# Patient Record
Sex: Female | Born: 1955 | Race: White | Hispanic: No | Marital: Married | Smoking: Never smoker
Health system: Southern US, Community
[De-identification: ages and names within clinical notes are randomized; demographics above are authoritative.]

## PROBLEM LIST (undated history)

## (undated) DIAGNOSIS — G5603 Carpal tunnel syndrome, bilateral upper limbs: Secondary | ICD-10-CM

## (undated) DIAGNOSIS — S0990XS Unspecified injury of head, sequela: Secondary | ICD-10-CM

## (undated) DIAGNOSIS — M5412 Radiculopathy, cervical region: Secondary | ICD-10-CM

## (undated) DIAGNOSIS — G2581 Restless legs syndrome: Secondary | ICD-10-CM

## (undated) DIAGNOSIS — I468 Cardiac arrest due to other underlying condition: Secondary | ICD-10-CM

## (undated) DIAGNOSIS — R748 Abnormal levels of other serum enzymes: Secondary | ICD-10-CM

## (undated) DIAGNOSIS — R29898 Other symptoms and signs involving the musculoskeletal system: Secondary | ICD-10-CM

## (undated) DIAGNOSIS — M50221 Other cervical disc displacement at C4-C5 level: Secondary | ICD-10-CM

## (undated) DIAGNOSIS — M159 Polyosteoarthritis, unspecified: Secondary | ICD-10-CM

## (undated) DIAGNOSIS — I10 Essential (primary) hypertension: Secondary | ICD-10-CM

## (undated) DIAGNOSIS — G931 Anoxic brain damage, not elsewhere classified: Secondary | ICD-10-CM

## (undated) DIAGNOSIS — E041 Nontoxic single thyroid nodule: Secondary | ICD-10-CM

## (undated) DIAGNOSIS — R413 Other amnesia: Secondary | ICD-10-CM

## (undated) DIAGNOSIS — L719 Rosacea, unspecified: Secondary | ICD-10-CM

## (undated) DIAGNOSIS — G6182 Multifocal motor neuropathy: Secondary | ICD-10-CM

## (undated) DIAGNOSIS — F329 Major depressive disorder, single episode, unspecified: Secondary | ICD-10-CM

## (undated) DIAGNOSIS — R928 Other abnormal and inconclusive findings on diagnostic imaging of breast: Secondary | ICD-10-CM

## (undated) DIAGNOSIS — R270 Ataxia, unspecified: Secondary | ICD-10-CM

## (undated) DIAGNOSIS — F32A Depression, unspecified: Secondary | ICD-10-CM

## (undated) DIAGNOSIS — M5126 Other intervertebral disc displacement, lumbar region: Secondary | ICD-10-CM

## (undated) DIAGNOSIS — M5416 Radiculopathy, lumbar region: Secondary | ICD-10-CM

## (undated) DIAGNOSIS — E0590 Thyrotoxicosis, unspecified without thyrotoxic crisis or storm: Secondary | ICD-10-CM

## (undated) DIAGNOSIS — R42 Dizziness and giddiness: Secondary | ICD-10-CM

## (undated) DIAGNOSIS — E119 Type 2 diabetes mellitus without complications: Secondary | ICD-10-CM

## (undated) DIAGNOSIS — M51369 Other intervertebral disc degeneration, lumbar region without mention of lumbar back pain or lower extremity pain: Secondary | ICD-10-CM

## (undated) DIAGNOSIS — M5136 Other intervertebral disc degeneration, lumbar region: Secondary | ICD-10-CM

## (undated) DIAGNOSIS — R1313 Dysphagia, pharyngeal phase: Secondary | ICD-10-CM

## (undated) DIAGNOSIS — K219 Gastro-esophageal reflux disease without esophagitis: Secondary | ICD-10-CM

## (undated) DIAGNOSIS — N951 Menopausal and female climacteric states: Secondary | ICD-10-CM

## (undated) DIAGNOSIS — S5402XA Injury of ulnar nerve at forearm level, left arm, initial encounter: Secondary | ICD-10-CM

## (undated) DIAGNOSIS — G5602 Carpal tunnel syndrome, left upper limb: Secondary | ICD-10-CM

## (undated) DIAGNOSIS — M1990 Unspecified osteoarthritis, unspecified site: Secondary | ICD-10-CM

## (undated) HISTORY — DX: Abnormal levels of other serum enzymes: R74.8

## (undated) HISTORY — PX: BREAST SURGERY: SHX581

## (undated) HISTORY — PX: TUBAL LIGATION: SHX77

## (undated) HISTORY — DX: Other intervertebral disc degeneration, lumbar region without mention of lumbar back pain or lower extremity pain: M51.369

## (undated) HISTORY — DX: Ataxia, unspecified: R27.0

## (undated) HISTORY — DX: Dizziness and giddiness: S09.90XS

## (undated) HISTORY — DX: Polyosteoarthritis, unspecified: M15.9

## (undated) HISTORY — DX: Menopausal and female climacteric states: N95.1

## (undated) HISTORY — DX: Gastro-esophageal reflux disease without esophagitis: K21.9

## (undated) HISTORY — DX: Nontoxic single thyroid nodule: E04.1

## (undated) HISTORY — DX: Other intervertebral disc degeneration, lumbar region: M51.36

## (undated) HISTORY — DX: Other symptoms and signs involving the musculoskeletal system: R29.898

## (undated) HISTORY — PX: SPLENECTOMY, TOTAL: SHX788

## (undated) HISTORY — PX: TONSILLECTOMY: SUR1361

## (undated) HISTORY — DX: Injury of ulnar nerve at forearm level, left arm, initial encounter: S54.02XA

## (undated) HISTORY — DX: Restless legs syndrome: G25.81

## (undated) HISTORY — DX: Carpal tunnel syndrome, bilateral upper limbs: G56.03

## (undated) HISTORY — DX: Radiculopathy, cervical region: M54.12

## (undated) HISTORY — DX: Carpal tunnel syndrome, left upper limb: G56.02

## (undated) HISTORY — DX: Rosacea, unspecified: L71.9

## (undated) HISTORY — DX: Hypercalcemia: E83.52

## (undated) HISTORY — DX: Multifocal motor neuropathy: G61.82

## (undated) HISTORY — PX: HIP ARTHROPLASTY: SHX981

## (undated) HISTORY — DX: Dysphagia, pharyngeal phase: R13.13

## (undated) HISTORY — DX: Dizziness and giddiness: R42

## (undated) HISTORY — DX: Anoxic brain damage, not elsewhere classified: G93.1

## (undated) HISTORY — DX: Radiculopathy, lumbar region: M54.16

## (undated) HISTORY — DX: Other amnesia: R41.3

## (undated) HISTORY — PX: ABDOMINAL HYSTERECTOMY: SHX81

## (undated) HISTORY — PX: TONSILLECTOMY AND ADENOIDECTOMY: SUR1326

## (undated) HISTORY — DX: Other cervical disc displacement at C4-C5 level: M50.221

## (undated) HISTORY — DX: Other intervertebral disc displacement, lumbar region: M51.26

---

## 1997-10-09 ENCOUNTER — Other Ambulatory Visit: Admission: RE | Admit: 1997-10-09 | Discharge: 1997-10-09 | Payer: Self-pay

## 1998-11-25 ENCOUNTER — Other Ambulatory Visit: Admission: RE | Admit: 1998-11-25 | Discharge: 1998-11-25 | Payer: Self-pay | Admitting: Family Medicine

## 2000-02-20 ENCOUNTER — Encounter: Admission: RE | Admit: 2000-02-20 | Discharge: 2000-02-20 | Payer: Self-pay | Admitting: Obstetrics and Gynecology

## 2000-02-20 ENCOUNTER — Encounter: Payer: Self-pay | Admitting: Obstetrics and Gynecology

## 2001-11-27 ENCOUNTER — Other Ambulatory Visit: Admission: RE | Admit: 2001-11-27 | Discharge: 2001-11-27 | Payer: Self-pay | Admitting: Family Medicine

## 2004-09-13 ENCOUNTER — Ambulatory Visit: Payer: Self-pay | Admitting: Family Medicine

## 2004-10-20 ENCOUNTER — Ambulatory Visit: Payer: Self-pay | Admitting: Family Medicine

## 2005-01-26 ENCOUNTER — Ambulatory Visit: Payer: Self-pay | Admitting: Family Medicine

## 2005-03-03 ENCOUNTER — Ambulatory Visit: Payer: Self-pay | Admitting: Family Medicine

## 2005-03-03 ENCOUNTER — Other Ambulatory Visit: Admission: RE | Admit: 2005-03-03 | Discharge: 2005-03-03 | Payer: Self-pay | Admitting: Family Medicine

## 2005-03-09 ENCOUNTER — Ambulatory Visit: Payer: Self-pay | Admitting: Family Medicine

## 2005-04-27 ENCOUNTER — Ambulatory Visit: Payer: Self-pay | Admitting: Family Medicine

## 2005-05-05 ENCOUNTER — Ambulatory Visit: Payer: Self-pay | Admitting: Family Medicine

## 2005-05-26 ENCOUNTER — Ambulatory Visit: Payer: Self-pay | Admitting: Family Medicine

## 2005-06-07 ENCOUNTER — Ambulatory Visit: Payer: Self-pay | Admitting: Family Medicine

## 2005-06-30 ENCOUNTER — Ambulatory Visit: Payer: Self-pay | Admitting: Family Medicine

## 2005-07-03 ENCOUNTER — Ambulatory Visit: Payer: Self-pay | Admitting: Family Medicine

## 2005-07-28 ENCOUNTER — Ambulatory Visit: Payer: Self-pay | Admitting: Family Medicine

## 2005-08-23 ENCOUNTER — Ambulatory Visit: Payer: Self-pay | Admitting: Family Medicine

## 2013-04-24 DIAGNOSIS — G931 Anoxic brain damage, not elsewhere classified: Secondary | ICD-10-CM | POA: Insufficient documentation

## 2013-05-27 DIAGNOSIS — R42 Dizziness and giddiness: Secondary | ICD-10-CM | POA: Insufficient documentation

## 2015-08-06 DIAGNOSIS — M5412 Radiculopathy, cervical region: Secondary | ICD-10-CM | POA: Insufficient documentation

## 2015-08-06 DIAGNOSIS — G5603 Carpal tunnel syndrome, bilateral upper limbs: Secondary | ICD-10-CM | POA: Insufficient documentation

## 2015-08-24 DIAGNOSIS — E782 Mixed hyperlipidemia: Secondary | ICD-10-CM | POA: Insufficient documentation

## 2015-08-24 DIAGNOSIS — F3340 Major depressive disorder, recurrent, in remission, unspecified: Secondary | ICD-10-CM | POA: Insufficient documentation

## 2015-08-24 DIAGNOSIS — E119 Type 2 diabetes mellitus without complications: Secondary | ICD-10-CM | POA: Insufficient documentation

## 2015-08-24 DIAGNOSIS — I10 Essential (primary) hypertension: Secondary | ICD-10-CM | POA: Insufficient documentation

## 2015-09-16 DIAGNOSIS — M159 Polyosteoarthritis, unspecified: Secondary | ICD-10-CM | POA: Insufficient documentation

## 2015-09-29 DIAGNOSIS — R270 Ataxia, unspecified: Secondary | ICD-10-CM | POA: Insufficient documentation

## 2015-09-29 DIAGNOSIS — G2581 Restless legs syndrome: Secondary | ICD-10-CM | POA: Insufficient documentation

## 2015-09-29 DIAGNOSIS — R29898 Other symptoms and signs involving the musculoskeletal system: Secondary | ICD-10-CM | POA: Insufficient documentation

## 2015-09-29 DIAGNOSIS — M5136 Other intervertebral disc degeneration, lumbar region: Secondary | ICD-10-CM | POA: Insufficient documentation

## 2015-10-05 DIAGNOSIS — M50221 Other cervical disc displacement at C4-C5 level: Secondary | ICD-10-CM | POA: Insufficient documentation

## 2015-10-22 DIAGNOSIS — S5402XA Injury of ulnar nerve at forearm level, left arm, initial encounter: Secondary | ICD-10-CM | POA: Insufficient documentation

## 2016-05-17 DIAGNOSIS — G6182 Multifocal motor neuropathy: Secondary | ICD-10-CM | POA: Insufficient documentation

## 2016-07-04 DIAGNOSIS — Z560 Unemployment, unspecified: Secondary | ICD-10-CM | POA: Insufficient documentation

## 2016-07-07 DIAGNOSIS — L719 Rosacea, unspecified: Secondary | ICD-10-CM | POA: Insufficient documentation

## 2016-07-07 DIAGNOSIS — R748 Abnormal levels of other serum enzymes: Secondary | ICD-10-CM | POA: Insufficient documentation

## 2016-07-08 DIAGNOSIS — N959 Unspecified menopausal and perimenopausal disorder: Secondary | ICD-10-CM | POA: Insufficient documentation

## 2016-07-21 DIAGNOSIS — F419 Anxiety disorder, unspecified: Secondary | ICD-10-CM | POA: Insufficient documentation

## 2016-07-21 DIAGNOSIS — N179 Acute kidney failure, unspecified: Secondary | ICD-10-CM | POA: Insufficient documentation

## 2016-07-23 DIAGNOSIS — G8929 Other chronic pain: Secondary | ICD-10-CM | POA: Insufficient documentation

## 2016-07-23 DIAGNOSIS — R296 Repeated falls: Secondary | ICD-10-CM | POA: Insufficient documentation

## 2016-07-26 DIAGNOSIS — N390 Urinary tract infection, site not specified: Secondary | ICD-10-CM | POA: Insufficient documentation

## 2016-07-26 DIAGNOSIS — E876 Hypokalemia: Secondary | ICD-10-CM | POA: Insufficient documentation

## 2016-09-13 DIAGNOSIS — R600 Localized edema: Secondary | ICD-10-CM | POA: Insufficient documentation

## 2017-01-18 DIAGNOSIS — K219 Gastro-esophageal reflux disease without esophagitis: Secondary | ICD-10-CM | POA: Insufficient documentation

## 2017-03-26 DIAGNOSIS — L309 Dermatitis, unspecified: Secondary | ICD-10-CM | POA: Insufficient documentation

## 2017-06-14 DIAGNOSIS — M5441 Lumbago with sciatica, right side: Secondary | ICD-10-CM | POA: Diagnosis not present

## 2017-06-14 DIAGNOSIS — M9903 Segmental and somatic dysfunction of lumbar region: Secondary | ICD-10-CM | POA: Diagnosis not present

## 2017-06-14 DIAGNOSIS — M62830 Muscle spasm of back: Secondary | ICD-10-CM | POA: Diagnosis not present

## 2017-06-14 DIAGNOSIS — M5412 Radiculopathy, cervical region: Secondary | ICD-10-CM | POA: Diagnosis not present

## 2017-06-14 DIAGNOSIS — M9905 Segmental and somatic dysfunction of pelvic region: Secondary | ICD-10-CM | POA: Diagnosis not present

## 2017-06-14 DIAGNOSIS — M9901 Segmental and somatic dysfunction of cervical region: Secondary | ICD-10-CM | POA: Diagnosis not present

## 2017-06-14 DIAGNOSIS — M9902 Segmental and somatic dysfunction of thoracic region: Secondary | ICD-10-CM | POA: Diagnosis not present

## 2017-06-14 DIAGNOSIS — M50323 Other cervical disc degeneration at C6-C7 level: Secondary | ICD-10-CM | POA: Diagnosis not present

## 2017-06-15 DIAGNOSIS — L930 Discoid lupus erythematosus: Secondary | ICD-10-CM | POA: Diagnosis not present

## 2017-06-15 DIAGNOSIS — L300 Nummular dermatitis: Secondary | ICD-10-CM | POA: Diagnosis not present

## 2017-06-25 DIAGNOSIS — M5441 Lumbago with sciatica, right side: Secondary | ICD-10-CM | POA: Diagnosis not present

## 2017-06-25 DIAGNOSIS — M9901 Segmental and somatic dysfunction of cervical region: Secondary | ICD-10-CM | POA: Diagnosis not present

## 2017-06-25 DIAGNOSIS — M5412 Radiculopathy, cervical region: Secondary | ICD-10-CM | POA: Diagnosis not present

## 2017-06-25 DIAGNOSIS — M9905 Segmental and somatic dysfunction of pelvic region: Secondary | ICD-10-CM | POA: Diagnosis not present

## 2017-06-25 DIAGNOSIS — M62830 Muscle spasm of back: Secondary | ICD-10-CM | POA: Diagnosis not present

## 2017-06-25 DIAGNOSIS — M9902 Segmental and somatic dysfunction of thoracic region: Secondary | ICD-10-CM | POA: Diagnosis not present

## 2017-06-25 DIAGNOSIS — M9903 Segmental and somatic dysfunction of lumbar region: Secondary | ICD-10-CM | POA: Diagnosis not present

## 2017-06-25 DIAGNOSIS — M50323 Other cervical disc degeneration at C6-C7 level: Secondary | ICD-10-CM | POA: Diagnosis not present

## 2017-07-03 DIAGNOSIS — N183 Chronic kidney disease, stage 3 (moderate): Secondary | ICD-10-CM | POA: Diagnosis not present

## 2017-07-03 DIAGNOSIS — I10 Essential (primary) hypertension: Secondary | ICD-10-CM | POA: Diagnosis not present

## 2017-07-03 DIAGNOSIS — N390 Urinary tract infection, site not specified: Secondary | ICD-10-CM | POA: Diagnosis not present

## 2017-07-16 DIAGNOSIS — M9901 Segmental and somatic dysfunction of cervical region: Secondary | ICD-10-CM | POA: Diagnosis not present

## 2017-07-16 DIAGNOSIS — M9903 Segmental and somatic dysfunction of lumbar region: Secondary | ICD-10-CM | POA: Diagnosis not present

## 2017-07-16 DIAGNOSIS — M9902 Segmental and somatic dysfunction of thoracic region: Secondary | ICD-10-CM | POA: Diagnosis not present

## 2017-07-16 DIAGNOSIS — M50323 Other cervical disc degeneration at C6-C7 level: Secondary | ICD-10-CM | POA: Diagnosis not present

## 2017-07-16 DIAGNOSIS — M9905 Segmental and somatic dysfunction of pelvic region: Secondary | ICD-10-CM | POA: Diagnosis not present

## 2017-07-20 DIAGNOSIS — L280 Lichen simplex chronicus: Secondary | ICD-10-CM | POA: Diagnosis not present

## 2017-07-20 DIAGNOSIS — L300 Nummular dermatitis: Secondary | ICD-10-CM | POA: Diagnosis not present

## 2017-07-24 DIAGNOSIS — I10 Essential (primary) hypertension: Secondary | ICD-10-CM | POA: Diagnosis not present

## 2017-07-24 DIAGNOSIS — E782 Mixed hyperlipidemia: Secondary | ICD-10-CM | POA: Diagnosis not present

## 2017-07-24 DIAGNOSIS — R7303 Prediabetes: Secondary | ICD-10-CM | POA: Diagnosis not present

## 2017-07-24 DIAGNOSIS — F3340 Major depressive disorder, recurrent, in remission, unspecified: Secondary | ICD-10-CM | POA: Diagnosis not present

## 2017-08-01 DIAGNOSIS — M25551 Pain in right hip: Secondary | ICD-10-CM | POA: Diagnosis not present

## 2017-08-01 DIAGNOSIS — M1611 Unilateral primary osteoarthritis, right hip: Secondary | ICD-10-CM | POA: Diagnosis not present

## 2017-08-03 DIAGNOSIS — M1611 Unilateral primary osteoarthritis, right hip: Secondary | ICD-10-CM | POA: Insufficient documentation

## 2017-08-28 DIAGNOSIS — N390 Urinary tract infection, site not specified: Secondary | ICD-10-CM | POA: Diagnosis not present

## 2017-08-28 DIAGNOSIS — I10 Essential (primary) hypertension: Secondary | ICD-10-CM | POA: Diagnosis not present

## 2017-08-28 DIAGNOSIS — R42 Dizziness and giddiness: Secondary | ICD-10-CM | POA: Diagnosis not present

## 2017-08-29 DIAGNOSIS — R42 Dizziness and giddiness: Secondary | ICD-10-CM | POA: Diagnosis not present

## 2017-09-13 DIAGNOSIS — M1611 Unilateral primary osteoarthritis, right hip: Secondary | ICD-10-CM | POA: Diagnosis not present

## 2017-09-14 DIAGNOSIS — L270 Generalized skin eruption due to drugs and medicaments taken internally: Secondary | ICD-10-CM | POA: Diagnosis not present

## 2017-09-14 DIAGNOSIS — L280 Lichen simplex chronicus: Secondary | ICD-10-CM | POA: Diagnosis not present

## 2017-09-14 DIAGNOSIS — D485 Neoplasm of uncertain behavior of skin: Secondary | ICD-10-CM | POA: Diagnosis not present

## 2017-09-19 DIAGNOSIS — G6182 Multifocal motor neuropathy: Secondary | ICD-10-CM | POA: Diagnosis not present

## 2017-09-19 DIAGNOSIS — R29898 Other symptoms and signs involving the musculoskeletal system: Secondary | ICD-10-CM | POA: Diagnosis not present

## 2017-09-20 DIAGNOSIS — L439 Lichen planus, unspecified: Secondary | ICD-10-CM | POA: Diagnosis not present

## 2017-09-20 DIAGNOSIS — R531 Weakness: Secondary | ICD-10-CM | POA: Diagnosis not present

## 2017-09-24 DIAGNOSIS — M1611 Unilateral primary osteoarthritis, right hip: Secondary | ICD-10-CM | POA: Diagnosis not present

## 2017-09-24 DIAGNOSIS — Z01810 Encounter for preprocedural cardiovascular examination: Secondary | ICD-10-CM | POA: Diagnosis not present

## 2017-09-24 DIAGNOSIS — E119 Type 2 diabetes mellitus without complications: Secondary | ICD-10-CM | POA: Diagnosis not present

## 2017-09-24 DIAGNOSIS — Z794 Long term (current) use of insulin: Secondary | ICD-10-CM | POA: Diagnosis not present

## 2017-09-24 DIAGNOSIS — Z01818 Encounter for other preprocedural examination: Secondary | ICD-10-CM | POA: Diagnosis not present

## 2017-10-02 DIAGNOSIS — I10 Essential (primary) hypertension: Secondary | ICD-10-CM | POA: Diagnosis not present

## 2017-10-02 DIAGNOSIS — M1611 Unilateral primary osteoarthritis, right hip: Secondary | ICD-10-CM | POA: Diagnosis not present

## 2017-10-02 DIAGNOSIS — E119 Type 2 diabetes mellitus without complications: Secondary | ICD-10-CM | POA: Diagnosis not present

## 2017-10-19 DIAGNOSIS — E782 Mixed hyperlipidemia: Secondary | ICD-10-CM | POA: Diagnosis not present

## 2017-10-19 DIAGNOSIS — E119 Type 2 diabetes mellitus without complications: Secondary | ICD-10-CM | POA: Diagnosis not present

## 2017-10-19 DIAGNOSIS — G6182 Multifocal motor neuropathy: Secondary | ICD-10-CM | POA: Diagnosis not present

## 2017-10-19 DIAGNOSIS — F3340 Major depressive disorder, recurrent, in remission, unspecified: Secondary | ICD-10-CM | POA: Diagnosis not present

## 2017-10-19 DIAGNOSIS — R29898 Other symptoms and signs involving the musculoskeletal system: Secondary | ICD-10-CM | POA: Diagnosis not present

## 2017-10-19 DIAGNOSIS — Z7982 Long term (current) use of aspirin: Secondary | ICD-10-CM | POA: Diagnosis not present

## 2017-10-19 DIAGNOSIS — I10 Essential (primary) hypertension: Secondary | ICD-10-CM | POA: Diagnosis not present

## 2017-10-19 DIAGNOSIS — M1611 Unilateral primary osteoarthritis, right hip: Secondary | ICD-10-CM | POA: Diagnosis not present

## 2017-10-19 DIAGNOSIS — K219 Gastro-esophageal reflux disease without esophagitis: Secondary | ICD-10-CM | POA: Diagnosis not present

## 2017-10-23 DIAGNOSIS — K5900 Constipation, unspecified: Secondary | ICD-10-CM | POA: Diagnosis not present

## 2017-10-23 DIAGNOSIS — Z96641 Presence of right artificial hip joint: Secondary | ICD-10-CM | POA: Diagnosis not present

## 2017-10-23 DIAGNOSIS — R2689 Other abnormalities of gait and mobility: Secondary | ICD-10-CM | POA: Diagnosis not present

## 2017-10-23 DIAGNOSIS — I10 Essential (primary) hypertension: Secondary | ICD-10-CM | POA: Diagnosis not present

## 2017-10-23 DIAGNOSIS — M25551 Pain in right hip: Secondary | ICD-10-CM | POA: Diagnosis not present

## 2017-10-27 DIAGNOSIS — M25551 Pain in right hip: Secondary | ICD-10-CM | POA: Diagnosis not present

## 2017-10-27 DIAGNOSIS — R2689 Other abnormalities of gait and mobility: Secondary | ICD-10-CM | POA: Diagnosis not present

## 2017-10-27 DIAGNOSIS — Z96641 Presence of right artificial hip joint: Secondary | ICD-10-CM | POA: Diagnosis not present

## 2017-10-27 DIAGNOSIS — I10 Essential (primary) hypertension: Secondary | ICD-10-CM | POA: Diagnosis not present

## 2017-10-27 DIAGNOSIS — K5900 Constipation, unspecified: Secondary | ICD-10-CM | POA: Diagnosis not present

## 2017-10-31 DIAGNOSIS — Z471 Aftercare following joint replacement surgery: Secondary | ICD-10-CM | POA: Diagnosis not present

## 2017-10-31 DIAGNOSIS — Z96641 Presence of right artificial hip joint: Secondary | ICD-10-CM | POA: Diagnosis not present

## 2017-11-06 DIAGNOSIS — M6281 Muscle weakness (generalized): Secondary | ICD-10-CM | POA: Diagnosis not present

## 2017-11-06 DIAGNOSIS — M25551 Pain in right hip: Secondary | ICD-10-CM | POA: Diagnosis not present

## 2017-11-06 DIAGNOSIS — M25651 Stiffness of right hip, not elsewhere classified: Secondary | ICD-10-CM | POA: Diagnosis not present

## 2017-11-06 DIAGNOSIS — R262 Difficulty in walking, not elsewhere classified: Secondary | ICD-10-CM | POA: Diagnosis not present

## 2017-11-08 DIAGNOSIS — R262 Difficulty in walking, not elsewhere classified: Secondary | ICD-10-CM | POA: Diagnosis not present

## 2017-11-08 DIAGNOSIS — M25651 Stiffness of right hip, not elsewhere classified: Secondary | ICD-10-CM | POA: Diagnosis not present

## 2017-11-08 DIAGNOSIS — M25551 Pain in right hip: Secondary | ICD-10-CM | POA: Diagnosis not present

## 2017-11-08 DIAGNOSIS — M6281 Muscle weakness (generalized): Secondary | ICD-10-CM | POA: Diagnosis not present

## 2017-11-13 DIAGNOSIS — R262 Difficulty in walking, not elsewhere classified: Secondary | ICD-10-CM | POA: Diagnosis not present

## 2017-11-13 DIAGNOSIS — M6281 Muscle weakness (generalized): Secondary | ICD-10-CM | POA: Diagnosis not present

## 2017-11-13 DIAGNOSIS — M25651 Stiffness of right hip, not elsewhere classified: Secondary | ICD-10-CM | POA: Diagnosis not present

## 2017-11-13 DIAGNOSIS — M25551 Pain in right hip: Secondary | ICD-10-CM | POA: Diagnosis not present

## 2017-11-15 DIAGNOSIS — M25551 Pain in right hip: Secondary | ICD-10-CM | POA: Diagnosis not present

## 2017-11-15 DIAGNOSIS — R262 Difficulty in walking, not elsewhere classified: Secondary | ICD-10-CM | POA: Diagnosis not present

## 2017-11-15 DIAGNOSIS — M6281 Muscle weakness (generalized): Secondary | ICD-10-CM | POA: Diagnosis not present

## 2017-11-15 DIAGNOSIS — M25651 Stiffness of right hip, not elsewhere classified: Secondary | ICD-10-CM | POA: Diagnosis not present

## 2017-11-19 DIAGNOSIS — R29898 Other symptoms and signs involving the musculoskeletal system: Secondary | ICD-10-CM | POA: Diagnosis not present

## 2017-11-19 DIAGNOSIS — G6182 Multifocal motor neuropathy: Secondary | ICD-10-CM | POA: Diagnosis not present

## 2017-11-20 DIAGNOSIS — M6281 Muscle weakness (generalized): Secondary | ICD-10-CM | POA: Diagnosis not present

## 2017-11-20 DIAGNOSIS — M25551 Pain in right hip: Secondary | ICD-10-CM | POA: Diagnosis not present

## 2017-11-20 DIAGNOSIS — M25651 Stiffness of right hip, not elsewhere classified: Secondary | ICD-10-CM | POA: Diagnosis not present

## 2017-11-20 DIAGNOSIS — R262 Difficulty in walking, not elsewhere classified: Secondary | ICD-10-CM | POA: Diagnosis not present

## 2017-11-22 DIAGNOSIS — R262 Difficulty in walking, not elsewhere classified: Secondary | ICD-10-CM | POA: Diagnosis not present

## 2017-11-22 DIAGNOSIS — M6281 Muscle weakness (generalized): Secondary | ICD-10-CM | POA: Diagnosis not present

## 2017-11-22 DIAGNOSIS — R42 Dizziness and giddiness: Secondary | ICD-10-CM | POA: Diagnosis not present

## 2017-11-22 DIAGNOSIS — M25651 Stiffness of right hip, not elsewhere classified: Secondary | ICD-10-CM | POA: Diagnosis not present

## 2017-11-22 DIAGNOSIS — M25551 Pain in right hip: Secondary | ICD-10-CM | POA: Diagnosis not present

## 2017-11-22 DIAGNOSIS — H6123 Impacted cerumen, bilateral: Secondary | ICD-10-CM | POA: Diagnosis not present

## 2017-11-26 DIAGNOSIS — M25651 Stiffness of right hip, not elsewhere classified: Secondary | ICD-10-CM | POA: Diagnosis not present

## 2017-11-26 DIAGNOSIS — M6281 Muscle weakness (generalized): Secondary | ICD-10-CM | POA: Diagnosis not present

## 2017-11-26 DIAGNOSIS — M25551 Pain in right hip: Secondary | ICD-10-CM | POA: Diagnosis not present

## 2017-11-26 DIAGNOSIS — R262 Difficulty in walking, not elsewhere classified: Secondary | ICD-10-CM | POA: Diagnosis not present

## 2017-11-29 DIAGNOSIS — M25651 Stiffness of right hip, not elsewhere classified: Secondary | ICD-10-CM | POA: Diagnosis not present

## 2017-11-29 DIAGNOSIS — Z96641 Presence of right artificial hip joint: Secondary | ICD-10-CM | POA: Insufficient documentation

## 2017-11-29 DIAGNOSIS — M6281 Muscle weakness (generalized): Secondary | ICD-10-CM | POA: Diagnosis not present

## 2017-11-29 DIAGNOSIS — M25551 Pain in right hip: Secondary | ICD-10-CM | POA: Diagnosis not present

## 2017-11-29 DIAGNOSIS — R262 Difficulty in walking, not elsewhere classified: Secondary | ICD-10-CM | POA: Diagnosis not present

## 2017-12-19 DIAGNOSIS — R29898 Other symptoms and signs involving the musculoskeletal system: Secondary | ICD-10-CM | POA: Diagnosis not present

## 2017-12-19 DIAGNOSIS — G6182 Multifocal motor neuropathy: Secondary | ICD-10-CM | POA: Diagnosis not present

## 2017-12-25 DIAGNOSIS — I10 Essential (primary) hypertension: Secondary | ICD-10-CM | POA: Diagnosis not present

## 2017-12-25 DIAGNOSIS — E559 Vitamin D deficiency, unspecified: Secondary | ICD-10-CM | POA: Diagnosis not present

## 2018-01-01 DIAGNOSIS — I10 Essential (primary) hypertension: Secondary | ICD-10-CM | POA: Diagnosis not present

## 2018-01-01 DIAGNOSIS — N390 Urinary tract infection, site not specified: Secondary | ICD-10-CM | POA: Diagnosis not present

## 2018-01-01 DIAGNOSIS — N183 Chronic kidney disease, stage 3 (moderate): Secondary | ICD-10-CM | POA: Diagnosis not present

## 2018-01-02 DIAGNOSIS — I10 Essential (primary) hypertension: Secondary | ICD-10-CM | POA: Diagnosis not present

## 2018-01-02 DIAGNOSIS — N183 Chronic kidney disease, stage 3 (moderate): Secondary | ICD-10-CM | POA: Diagnosis not present

## 2018-01-03 DIAGNOSIS — E119 Type 2 diabetes mellitus without complications: Secondary | ICD-10-CM | POA: Diagnosis not present

## 2018-01-18 ENCOUNTER — Other Ambulatory Visit: Payer: Self-pay | Admitting: *Deleted

## 2018-01-18 NOTE — Patient Outreach (Signed)
[facility]) Care Management  01/18/2018  Tammy Mcknight 08-01-1955 [id number]   Member identified as high risk according to [employer] health questionnaire.  Call placed to introduce [facility] care management services and perform telephone screening.  This care manager introduced self and purpose of call.  Screening complete, member denies any needs at this time, will not open case.  Valente David, [place], MSN [place] [phone]

## 2018-01-19 DIAGNOSIS — R29898 Other symptoms and signs involving the musculoskeletal system: Secondary | ICD-10-CM | POA: Diagnosis not present

## 2018-01-19 DIAGNOSIS — G6182 Multifocal motor neuropathy: Secondary | ICD-10-CM | POA: Diagnosis not present

## 2018-01-22 DIAGNOSIS — Z0000 Encounter for general adult medical examination without abnormal findings: Secondary | ICD-10-CM | POA: Diagnosis not present

## 2018-01-22 DIAGNOSIS — E782 Mixed hyperlipidemia: Secondary | ICD-10-CM | POA: Diagnosis not present

## 2018-01-22 DIAGNOSIS — R7303 Prediabetes: Secondary | ICD-10-CM | POA: Diagnosis not present

## 2018-01-22 DIAGNOSIS — E119 Type 2 diabetes mellitus without complications: Secondary | ICD-10-CM | POA: Diagnosis not present

## 2018-01-22 DIAGNOSIS — F3340 Major depressive disorder, recurrent, in remission, unspecified: Secondary | ICD-10-CM | POA: Diagnosis not present

## 2018-01-22 DIAGNOSIS — I10 Essential (primary) hypertension: Secondary | ICD-10-CM | POA: Diagnosis not present

## 2018-01-28 DIAGNOSIS — E119 Type 2 diabetes mellitus without complications: Secondary | ICD-10-CM | POA: Diagnosis not present

## 2018-02-01 DIAGNOSIS — N839 Noninflammatory disorder of ovary, fallopian tube and broad ligament, unspecified: Secondary | ICD-10-CM | POA: Diagnosis not present

## 2018-02-01 DIAGNOSIS — I10 Essential (primary) hypertension: Secondary | ICD-10-CM | POA: Diagnosis not present

## 2018-02-01 DIAGNOSIS — E559 Vitamin D deficiency, unspecified: Secondary | ICD-10-CM | POA: Diagnosis not present

## 2018-02-13 DIAGNOSIS — N390 Urinary tract infection, site not specified: Secondary | ICD-10-CM | POA: Diagnosis not present

## 2018-02-13 DIAGNOSIS — N179 Acute kidney failure, unspecified: Secondary | ICD-10-CM | POA: Diagnosis not present

## 2018-02-13 DIAGNOSIS — I10 Essential (primary) hypertension: Secondary | ICD-10-CM | POA: Diagnosis not present

## 2018-02-13 DIAGNOSIS — E875 Hyperkalemia: Secondary | ICD-10-CM | POA: Diagnosis not present

## 2018-02-14 DIAGNOSIS — R413 Other amnesia: Secondary | ICD-10-CM | POA: Diagnosis not present

## 2018-02-15 DIAGNOSIS — M8580 Other specified disorders of bone density and structure, unspecified site: Secondary | ICD-10-CM | POA: Diagnosis not present

## 2018-02-15 DIAGNOSIS — N959 Unspecified menopausal and perimenopausal disorder: Secondary | ICD-10-CM | POA: Diagnosis not present

## 2018-02-15 DIAGNOSIS — M8589 Other specified disorders of bone density and structure, multiple sites: Secondary | ICD-10-CM | POA: Diagnosis not present

## 2018-02-15 DIAGNOSIS — Z1231 Encounter for screening mammogram for malignant neoplasm of breast: Secondary | ICD-10-CM | POA: Diagnosis not present

## 2018-02-19 DIAGNOSIS — G6182 Multifocal motor neuropathy: Secondary | ICD-10-CM | POA: Diagnosis not present

## 2018-02-19 DIAGNOSIS — R29898 Other symptoms and signs involving the musculoskeletal system: Secondary | ICD-10-CM | POA: Diagnosis not present

## 2018-02-20 DIAGNOSIS — H2513 Age-related nuclear cataract, bilateral: Secondary | ICD-10-CM | POA: Diagnosis not present

## 2018-02-25 DIAGNOSIS — R413 Other amnesia: Secondary | ICD-10-CM | POA: Diagnosis not present

## 2018-03-01 DIAGNOSIS — N390 Urinary tract infection, site not specified: Secondary | ICD-10-CM | POA: Diagnosis not present

## 2018-03-01 DIAGNOSIS — R358 Other polyuria: Secondary | ICD-10-CM | POA: Diagnosis not present

## 2018-03-07 ENCOUNTER — Other Ambulatory Visit: Payer: Self-pay | Admitting: Family Medicine

## 2018-03-07 DIAGNOSIS — R922 Inconclusive mammogram: Secondary | ICD-10-CM | POA: Diagnosis not present

## 2018-03-07 DIAGNOSIS — N6489 Other specified disorders of breast: Secondary | ICD-10-CM | POA: Diagnosis not present

## 2018-03-07 DIAGNOSIS — R928 Other abnormal and inconclusive findings on diagnostic imaging of breast: Secondary | ICD-10-CM | POA: Diagnosis not present

## 2018-03-08 DIAGNOSIS — N3592 Unspecified urethral stricture, female: Secondary | ICD-10-CM | POA: Diagnosis not present

## 2018-03-08 DIAGNOSIS — R3129 Other microscopic hematuria: Secondary | ICD-10-CM | POA: Diagnosis not present

## 2018-03-15 ENCOUNTER — Ambulatory Visit
Admission: RE | Admit: 2018-03-15 | Discharge: 2018-03-15 | Disposition: A | Discharge status: Home / Self Care | Payer: Medicare (Managed Care) | Source: Ambulatory Visit | Attending: Family Medicine | Admitting: Family Medicine

## 2018-03-15 DIAGNOSIS — R928 Other abnormal and inconclusive findings on diagnostic imaging of breast: Secondary | ICD-10-CM | POA: Diagnosis not present

## 2018-03-15 DIAGNOSIS — N6489 Other specified disorders of breast: Secondary | ICD-10-CM | POA: Diagnosis not present

## 2018-03-20 DIAGNOSIS — N6022 Fibroadenosis of left breast: Secondary | ICD-10-CM | POA: Diagnosis not present

## 2018-03-21 DIAGNOSIS — R29898 Other symptoms and signs involving the musculoskeletal system: Secondary | ICD-10-CM | POA: Diagnosis not present

## 2018-03-21 DIAGNOSIS — G6182 Multifocal motor neuropathy: Secondary | ICD-10-CM | POA: Diagnosis not present

## 2018-03-25 DIAGNOSIS — D369 Benign neoplasm, unspecified site: Secondary | ICD-10-CM | POA: Diagnosis not present

## 2018-04-01 DIAGNOSIS — R413 Other amnesia: Secondary | ICD-10-CM | POA: Diagnosis not present

## 2018-04-12 DIAGNOSIS — E049 Nontoxic goiter, unspecified: Secondary | ICD-10-CM | POA: Diagnosis not present

## 2018-04-12 DIAGNOSIS — D369 Benign neoplasm, unspecified site: Secondary | ICD-10-CM | POA: Diagnosis not present

## 2018-04-15 DIAGNOSIS — R1310 Dysphagia, unspecified: Secondary | ICD-10-CM | POA: Diagnosis not present

## 2018-04-15 DIAGNOSIS — E041 Nontoxic single thyroid nodule: Secondary | ICD-10-CM | POA: Diagnosis not present

## 2018-04-16 DIAGNOSIS — E041 Nontoxic single thyroid nodule: Secondary | ICD-10-CM | POA: Insufficient documentation

## 2018-04-19 DIAGNOSIS — N3592 Unspecified urethral stricture, female: Secondary | ICD-10-CM | POA: Diagnosis not present

## 2018-04-21 DIAGNOSIS — G6182 Multifocal motor neuropathy: Secondary | ICD-10-CM | POA: Diagnosis not present

## 2018-04-21 DIAGNOSIS — R29898 Other symptoms and signs involving the musculoskeletal system: Secondary | ICD-10-CM | POA: Diagnosis not present

## 2018-04-24 DIAGNOSIS — E119 Type 2 diabetes mellitus without complications: Secondary | ICD-10-CM | POA: Diagnosis not present

## 2018-04-25 ENCOUNTER — Ambulatory Visit: Payer: Self-pay | Admitting: General Surgery

## 2018-04-25 DIAGNOSIS — E119 Type 2 diabetes mellitus without complications: Secondary | ICD-10-CM | POA: Diagnosis not present

## 2018-04-25 DIAGNOSIS — N6022 Fibroadenosis of left breast: Secondary | ICD-10-CM

## 2018-04-26 ENCOUNTER — Other Ambulatory Visit: Payer: Self-pay | Admitting: General Surgery

## 2018-04-26 DIAGNOSIS — N6022 Fibroadenosis of left breast: Secondary | ICD-10-CM

## 2018-05-01 NOTE — Pre-Procedure Instructions (Signed)
ADELYNNE JOERGER  05/01/2018      [facility] DRUG STORE [place], [place] - [address] AT [address] [place] [address] Phone: [phone] Fax: [phone]    Your procedure is scheduled on Monday December 2nd.  Report to [facility] Admitting at 1200 P.M.  Call this number if you have problems the morning of surgery:  [phone]   Remember:  Do not eat or drink after midnight.  You may drink clear liquids until 1100am .  Clear liquids allowed are:                    Water, Juice (non-citric and without pulp), Carbonated beverages, Clear Tea, Black Coffee only and Gatorade    Take these medicines the morning of surgery with A SIP OF WATER   acetaminophen (TYLENOL) if needed  atorvastatin (LIPITOR)   citalopram (CELEXA)  estradiol (ESTRACE)   labetalol (NORMODYNE)   7 days prior to surgery STOP taking any Aspirin(unless otherwise instructed by your surgeon), Aleve, Naproxen, Ibuprofen, Motrin, Advil, Goody's, BC's, all herbal medications, fish oil, and all vitamins     Do not wear jewelry, make-up or nail polish.  Do not wear lotions, powders, or perfumes, or deodorant.  Do not shave 48 hours prior to surgery.  Men may shave face and neck.  Do not bring valuables to the hospital.  [facility] is not responsible for any belongings or valuables.  Contacts, dentures or bridgework may not be worn into surgery.  Leave your suitcase in the car.  After surgery it may be brought to your room.  For patients admitted to the hospital, discharge time will be determined by your treatment team.  Patients discharged the day of surgery will not be allowed to drive home.    [facility]- Preparing For Surgery  Before surgery, you can play an important role. Because skin is not sterile, your skin needs to be as free of germs as possible. You can reduce the number of germs on your skin by washing with CHG (chlorahexidine gluconate) Soap  before surgery.  CHG is an antiseptic cleaner which kills germs and bonds with the skin to continue killing germs even after washing.    Oral Hygiene is also important to reduce your risk of infection.  Remember - BRUSH YOUR TEETH THE MORNING OF SURGERY WITH YOUR REGULAR TOOTHPASTE  Please do not use if you have an allergy to CHG or antibacterial soaps. If your skin becomes reddened/irritated stop using the CHG.  Do not shave (including legs and underarms) for at least 48 hours prior to first CHG shower. It is OK to shave your face.  Please follow these instructions carefully.   1. Shower the NIGHT BEFORE SURGERY and the MORNING OF SURGERY with CHG.   2. If you chose to wash your hair, wash your hair first as usual with your normal shampoo.  3. After you shampoo, rinse your hair and body thoroughly to remove the shampoo.  4. Use CHG as you would any other liquid soap. You can apply CHG directly to the skin and wash gently with a scrungie or a clean washcloth.   5. Apply the CHG Soap to your body ONLY FROM THE NECK DOWN.  Do not use on open wounds or open sores. Avoid contact with your eyes, ears, mouth and genitals (private parts). Wash Face and genitals (private parts)  with your normal soap.  6. Wash thoroughly, paying special attention to the area where your surgery will be performed.  7. Thoroughly rinse your body with warm water from the neck down.  8. DO NOT shower/wash with your normal soap after using and rinsing off the CHG Soap.  9. Pat yourself dry with a CLEAN TOWEL.  10. Wear CLEAN PAJAMAS to bed the night before surgery, wear comfortable clothes the morning of surgery  11. Place CLEAN SHEETS on your bed the night of your first shower and DO NOT SLEEP WITH PETS.    Day of Surgery:  Do not apply any deodorants/lotions.  Please wear clean clothes to the hospital/surgery center.   Remember to brush your teeth WITH YOUR REGULAR TOOTHPASTE.    Please read over the  following fact sheets that you were given.

## 2018-05-02 ENCOUNTER — Other Ambulatory Visit: Payer: Self-pay

## 2018-05-02 ENCOUNTER — Encounter (HOSPITAL_COMMUNITY)
Admission: RE | Admit: 2018-05-02 | Discharge: 2018-05-02 | Disposition: A | Discharge status: Home / Self Care | Payer: PPO | Source: Ambulatory Visit | Attending: General Surgery | Admitting: General Surgery

## 2018-05-02 ENCOUNTER — Encounter (HOSPITAL_COMMUNITY): Payer: Self-pay

## 2018-05-02 DIAGNOSIS — N6022 Fibroadenosis of left breast: Secondary | ICD-10-CM | POA: Insufficient documentation

## 2018-05-02 DIAGNOSIS — Z79899 Other long term (current) drug therapy: Secondary | ICD-10-CM | POA: Insufficient documentation

## 2018-05-02 DIAGNOSIS — E119 Type 2 diabetes mellitus without complications: Secondary | ICD-10-CM | POA: Insufficient documentation

## 2018-05-02 DIAGNOSIS — Z01818 Encounter for other preprocedural examination: Secondary | ICD-10-CM | POA: Diagnosis not present

## 2018-05-02 HISTORY — DX: Type 2 diabetes mellitus without complications: E11.9

## 2018-05-02 HISTORY — DX: Cardiac arrest due to other underlying condition: I46.8

## 2018-05-02 HISTORY — DX: Depression, unspecified: F32.A

## 2018-05-02 HISTORY — DX: Essential (primary) hypertension: I10

## 2018-05-02 HISTORY — DX: Major depressive disorder, single episode, unspecified: F32.9

## 2018-05-02 HISTORY — DX: Thyrotoxicosis, unspecified without thyrotoxic crisis or storm: E05.90

## 2018-05-02 HISTORY — DX: Other abnormal and inconclusive findings on diagnostic imaging of breast: R92.8

## 2018-05-02 HISTORY — DX: Unspecified osteoarthritis, unspecified site: M19.90

## 2018-05-02 LAB — BASIC METABOLIC PANEL
Anion gap: 8 (ref 5–15)
BUN: 20 mg/dL (ref 8–23)
CALCIUM: 9.9 mg/dL (ref 8.9–10.3)
CO2: 23 mmol/L (ref 22–32)
Chloride: 108 mmol/L (ref 98–111)
Creatinine, Ser: 0.78 mg/dL (ref 0.44–1.00)
GFR calc Af Amer: >60 mL/min (ref >60)
GFR calc non Af Amer: >60 mL/min (ref >60)
GLUCOSE: 126 mg/dL — AB (ref 70–99)
Potassium: 3.9 mmol/L (ref 3.5–5.1)
Sodium: 139 mmol/L (ref 135–145)

## 2018-05-02 LAB — CBC
HEMATOCRIT: 45.2 % (ref 36.0–46.0)
Hemoglobin: 14.2 g/dL (ref 12.0–15.0)
MCH: 29 pg (ref 26.0–34.0)
MCHC: 31.4 g/dL (ref 30.0–36.0)
MCV: 92.4 fL (ref 80.0–100.0)
Platelets: 306 10*3/uL (ref 150–400)
RBC: 4.89 MIL/uL (ref 3.87–5.11)
RDW: 13.6 % (ref 11.5–15.5)
WBC: 12.4 10*3/uL — ABNORMAL HIGH (ref 4.0–10.5)
nRBC: 0 % (ref 0.0–0.2)

## 2018-05-02 LAB — GLUCOSE, CAPILLARY: Glucose-Capillary: 110 mg/dL — ABNORMAL HIGH (ref 70–99)

## 2018-05-02 LAB — HEMOGLOBIN A1C
HEMOGLOBIN A1C: 6.4 % — AB (ref 4.8–5.6)
MEAN PLASMA GLUCOSE: 136.98 mg/dL

## 2018-05-02 NOTE — Progress Notes (Signed)
Tammy Mcknight            05/01/2018                          [facility] DRUG STORE [place], [place] - [address] AT [address] [place] [address] Phone: [phone] Fax: [phone]              Your procedure is scheduled on Monday December 2nd.            Report to [facility] Admitting at 1200 P.M.            Call this number if you have problems the morning of surgery:            [phone]             Remember:            Do not eat after midnight. On Dec. 1st            You may drink clear liquids until 1100am .  Clear liquids allowed are:   Water, Juice (non-citric and without pulp), Carbonated beverages, Clear Tea, Black Coffee only and Gatorade                        Take these medicines the morning of surgery with A SIP OF WATER             acetaminophen (TYLENOL) if needed            atorvastatin (LIPITOR)             citalopram (CELEXA)            estradiol (ESTRACE)             labetalol (NORMODYNE)             7 days prior to surgery (05/06/18), STOP taking any Aspirin(unless otherwise instructed by your surgeon), Aleve, Naproxen, Ibuprofen, Motrin, Advil, Goody's, BC's, all herbal medications, fish oil, and all vitamins.  How to Manage Your Diabetes Before and After Surgery  Why is it important to control my blood sugar before and after surgery? . Improving blood sugar levels before and after surgery helps healing and can limit problems. . A way of improving blood sugar control is eating a healthy diet by: o  Eating less sugar and carbohydrates o  Increasing activity/exercise o  Talking with your doctor about reaching your blood sugar goals . High blood sugars (greater than 180 mg/dL) can raise your risk of infections and slow your recovery, so you will need to focus on controlling your diabetes during the weeks before surgery. . Make sure that the doctor who takes care of your diabetes knows about your  planned surgery including the date and location.  How do I manage my blood sugar before surgery? . Check your blood sugar at least 4 times a day, starting 2 days before surgery, to make sure that the level is not too high or low. o Check your blood sugar the morning of your surgery when you wake up and every 2 hours until you get to the Short Stay unit. . If your blood sugar is less than 70 mg/dL, you will need to treat for low blood sugar: o Do not take insulin. o Treat a low blood sugar (less than 70 mg/dL) with  cup of clear juice (  cranberry or apple), 4 glucose tablets, OR glucose gel. Recheck blood sugar in 15 minutes after treatment (to make sure it is greater than 70 mg/dL). If your blood sugar is not greater than 70 mg/dL on recheck, call [phone] o  for further instructions. . Report your blood sugar if above 220mg /dl at the number above for further instructions  . If you are admitted to the hospital after surgery: o Your blood sugar will be checked by the staff and you will probably be given insulin after surgery (instead of oral diabetes medicines) to make sure you have good blood sugar levels. o The goal for blood sugar control after surgery is 80-180 mg/dL.  Reviewed and Endorsed by [facility] Patient Education Committee, August 2015              Do not wear jewelry, make-up or nail polish.            Do not wear lotions, powders, or perfumes, or deodorant.            Do not shave 48 hours prior to surgery.  Men may shave face and neck.            Do not bring valuables to the hospital.            [facility] is not responsible for any belongings or valuables.  Contacts, dentures or bridgework may not be worn into surgery.  Leave your suitcase in the car.  After surgery it may be brought to your room.  For patients admitted to the hospital, discharge time will be determined by your treatment team.  Patients discharged the day of surgery will not be allowed to drive  home.    [facility]- Preparing For Surgery  Before surgery, you can play an important role. Because skin is not sterile, your skin needs to be as free of germs as possible. You can reduce the number of germs on your skin by washing with CHG (chlorahexidine gluconate) Soap before surgery.  CHG is an antiseptic cleaner which kills germs and bonds with the skin to continue killing germs even after washing.    Oral Hygiene is also important to reduce your risk of infection.  Remember - BRUSH YOUR TEETH THE MORNING OF SURGERY WITH YOUR REGULAR TOOTHPASTE  Please do not use if you have an allergy to CHG or antibacterial soaps. If your skin becomes reddened/irritated stop using the CHG.  Do not shave (including legs and underarms) for at least 48 hours prior to first CHG shower. It is OK to shave your face.  Please follow these instructions carefully.                                                                                                                     1. Shower the NIGHT BEFORE SURGERY and the MORNING OF SURGERY with CHG.   2. If you chose to wash your hair, wash your hair first as usual with your normal shampoo.  3.  After you shampoo, rinse your hair and body thoroughly to remove the shampoo.  4. Use CHG as you would any other liquid soap. You can apply CHG directly to the skin and wash gently with a scrungie or a clean washcloth.   5. Apply the CHG Soap to your body ONLY FROM THE NECK DOWN.  Do not use on open wounds or open sores. Avoid contact with your eyes, ears, mouth and genitals (private parts). Wash Face and genitals (private parts)  with your normal soap.  6. Wash thoroughly, paying special attention to the area where your surgery will be performed.  7. Thoroughly rinse your body with warm water from the neck down.  8. DO NOT shower/wash with your normal soap after using and rinsing off the CHG Soap.  9. Pat yourself dry with a CLEAN TOWEL.  10. Wear  CLEAN PAJAMAS to bed the night before surgery, wear comfortable clothes the morning of surgery  11. Place CLEAN SHEETS on your bed the night of your first shower and DO NOT SLEEP WITH PETS.    Day of Surgery:  Do not apply any deodorants/lotions.  Please wear clean clothes to the hospital/surgery center.   Remember to brush your teeth WITH YOUR REGULAR TOOTHPASTE.    Please read over the following fact sheets that you were given.

## 2018-05-02 NOTE — Progress Notes (Signed)
PCP - Dr. Teressa Lower  Cardiologist - Denies  Chest x-ray - Denies  EKG - 09/24/17(CE)-Req'd  Stress Test - Denies  ECHO - Denies  Cardiac Cath - Denies  AICD- na PM- na LOOP- na  Sleep Study - Denies CPAP - None  LABS- 05/02/18: CBC, BMP  ASA- Denies  HA1C- 05/02/18 Fasting Blood Sugar - 105, today 110 Checks Blood Sugar ___2__ times a week  Anesthesia- Yes- req'd EKG  Pt denies having chest pain, sob, or fever at this time. All instructions explained to the pt, with a verbal understanding of the material. Pt agrees to go over the instructions while at home for a better understanding. The opportunity to ask questions was provided.

## 2018-05-03 NOTE — Anesthesia Preprocedure Evaluation (Addendum)
Anesthesia Evaluation  Patient identified by MRN, date of birth, ID band Patient awake    Reviewed: Allergy & Precautions, NPO status , Patient's Chart, lab work & pertinent test results, reviewed documented beta blocker date and time   Airway Mallampati: I       Dental no notable dental hx. (+) Teeth Intact   Pulmonary neg pulmonary ROS,    Pulmonary exam normal breath sounds clear to auscultation       Cardiovascular hypertension, Pt. on medications and Pt. on home beta blockers Normal cardiovascular exam Rhythm:Regular Rate:Normal     Neuro/Psych Depression negative neurological ROS     GI/Hepatic negative GI ROS, Neg liver ROS,   Endo/Other  diabetes, Type 2  Renal/GU negative Renal ROS  negative genitourinary   Musculoskeletal  (+) Arthritis , Osteoarthritis,    Abdominal Normal abdominal exam  (+)   Peds  Hematology   Anesthesia Other Findings   Reproductive/Obstetrics                            Anesthesia Physical Anesthesia Plan  ASA: II  Anesthesia Plan: General   Post-op Pain Management:    Induction: Intravenous  PONV Risk Score and Plan: 4 or greater and Ondansetron and Midazolam  Airway Management Planned: LMA  Additional Equipment:   Intra-op Plan:   Post-operative Plan: Extubation in OR  Informed Consent: I have reviewed the patients History and Physical, chart, labs and discussed the procedure including the risks, benefits and alternatives for the proposed anesthesia with the patient or authorized representative who has indicated his/her understanding and acceptance.   Dental advisory given  Plan Discussed with: CRNA  Anesthesia Plan Comments: (See PAT note 05/02/2018 by Karoline Caldwell, PA-C )       Anesthesia Quick Evaluation

## 2018-05-03 NOTE — Progress Notes (Signed)
Anesthesia Chart Review:  Case:  [id number] Date/Time:  05/13/18 1345   Procedure:  LEFT BREAST LUMPECTOMY WITH RADIOACTIVE SEED LOCALIZATION (Left Breast)   Anesthesia type:  General   Pre-op diagnosis:  LEFT BREAST COMPLEX SCLEROSING LESION   Location:  [facility] OR ROOM 09 / [facility]   Surgeon:  Jovita Kussmaul, MD      DISCUSSION: 62 yo female Pertinent hx includes Brain injury with loss of consciousness (2014), Cardiac arrest due to trauma (2014),  H/O splenectomy (2014), GERD, Hypercalcemia, DMII, HLD, Hypertension,  Restless leg syndrome, Rheumatoid arthritis, DOE, Uses roller walker, Weakness of both legs, and Weakness of left hand.  In 2014 pt had had a mechanical fall resulting in a splenic laceration that causes significant hemoperitoneum. Required multiple units of blood and IVF, and emergent exploratory laparotomy for splenectomy.  Recent right total hip arthroplasty 10/19/17 at [facility] without complication. Anesthesia airway notes from care everywhere:  Endotracheal tube insertion site: oral Blade: Macintosh Blade size: #3 ETT size: 7.0 mm Cuffed: yes View (Cormack Lehane grade): grade I - visualization of entire laryngeal aperture Placement verified by: chest auscultation/breath sounds equal bilaterally and capnometry/+EtCO2  Measured from: teeth ETT to teeth (cm): 21  Number of attempts at approach: 1 Number of other approaches attempted: 0 Airway placement trauma: none (in situ sore/laceration on top left upper lip noted prior to induction)  Anticipate she can proceed as planned barring acute status change.   VS: BP 104/90   Pulse 82   Temp 36.7 C   Resp 20   Ht 5\' 7"  (1.702 m)   Wt 76.1 kg   SpO2 97%   BMI 26.28 kg/m   PROVIDERS: Algis Greenhouse, MD is PCP  Zoila Shutter, MD is Neurologist  LABS: Labs reviewed: Acceptable for surgery. (all labs ordered are listed, but only abnormal results are displayed)  Labs Reviewed  GLUCOSE, CAPILLARY - Abnormal; Notable  for the following components:      Result Value   Glucose-Capillary 110 (*)    All other components within normal limits  HEMOGLOBIN A1C - Abnormal; Notable for the following components:   Hgb A1c MFr Bld 6.4 (*)    All other components within normal limits  BASIC METABOLIC PANEL - Abnormal; Notable for the following components:   Glucose, Bld 126 (*)    All other components within normal limits  CBC - Abnormal; Notable for the following components:   WBC 12.4 (*)    All other components within normal limits     IMAGES: N/A   EKG: 09/24/2017 (care everywhere, narrative only, tracing requested): NSR. Rate 68.  CV: N/A  Past Medical History:  Diagnosis Date  . Arthritis   . Breast lesion on mammography    Left  . Cardiac arrest due to trauma [facility])    From a fall off of her porch 2014  . Depression   . Diabetes mellitus without complication ([place])    Tyoe II  . Hypertension   . Hyperthyroidism     Past Surgical History:  Procedure Laterality Date  . ABDOMINAL HYSTERECTOMY    . BREAST SURGERY     right benign lump  . HIP ARTHROPLASTY     Right  . SPLENECTOMY, TOTAL    . TONSILLECTOMY    . TONSILLECTOMY AND ADENOIDECTOMY    . TUBAL LIGATION      MEDICATIONS: . acetaminophen (TYLENOL) 500 MG tablet  . atorvastatin (LIPITOR) 40 MG tablet  . CINNAMON PO  .  citalopram (CELEXA) 40 MG tablet  . estradiol (ESTRACE) 0.1 MG/GM vaginal cream  . labetalol (NORMODYNE) 200 MG tablet  . Probiotic Product (PROBIOTIC PO)  . Turmeric (CURCUMIN 95 PO)  . TURMERIC PO  . valsartan (DIOVAN) 160 MG tablet   No current facility-administered medications for this encounter.     Wynonia Musty [facility] Short Stay Center/Anesthesiology Phone [phone] 05/03/2018 11:53 AM

## 2018-05-06 DIAGNOSIS — R1313 Dysphagia, pharyngeal phase: Secondary | ICD-10-CM | POA: Diagnosis not present

## 2018-05-06 DIAGNOSIS — E041 Nontoxic single thyroid nodule: Secondary | ICD-10-CM | POA: Diagnosis not present

## 2018-05-07 DIAGNOSIS — I10 Essential (primary) hypertension: Secondary | ICD-10-CM | POA: Diagnosis not present

## 2018-05-08 ENCOUNTER — Ambulatory Visit
Admission: RE | Admit: 2018-05-08 | Discharge: 2018-05-08 | Disposition: A | Discharge status: Home / Self Care | Payer: PPO | Source: Ambulatory Visit | Attending: General Surgery | Admitting: General Surgery

## 2018-05-08 DIAGNOSIS — N6022 Fibroadenosis of left breast: Secondary | ICD-10-CM

## 2018-05-08 DIAGNOSIS — R928 Other abnormal and inconclusive findings on diagnostic imaging of breast: Secondary | ICD-10-CM | POA: Diagnosis not present

## 2018-05-13 ENCOUNTER — Encounter (HOSPITAL_COMMUNITY)
Admission: RE | Disposition: A | Discharge status: Home / Self Care | Payer: Self-pay | Source: Ambulatory Visit | Attending: General Surgery

## 2018-05-13 ENCOUNTER — Ambulatory Visit
Admission: RE | Admit: 2018-05-13 | Discharge: 2018-05-13 | Disposition: A | Discharge status: Home / Self Care | Payer: Self-pay | Source: Ambulatory Visit | Attending: General Surgery | Admitting: General Surgery

## 2018-05-13 ENCOUNTER — Ambulatory Visit (HOSPITAL_COMMUNITY): Payer: PPO | Admitting: Physician Assistant

## 2018-05-13 ENCOUNTER — Ambulatory Visit (HOSPITAL_COMMUNITY)
Admission: RE | Admit: 2018-05-13 | Discharge: 2018-05-13 | Disposition: A | Discharge status: Home / Self Care | Payer: PPO | Source: Ambulatory Visit | Attending: General Surgery | Admitting: General Surgery

## 2018-05-13 ENCOUNTER — Encounter (HOSPITAL_COMMUNITY): Payer: Self-pay

## 2018-05-13 ENCOUNTER — Other Ambulatory Visit: Payer: Self-pay

## 2018-05-13 ENCOUNTER — Ambulatory Visit (HOSPITAL_COMMUNITY): Payer: PPO | Admitting: Anesthesiology

## 2018-05-13 DIAGNOSIS — Z8673 Personal history of transient ischemic attack (TIA), and cerebral infarction without residual deficits: Secondary | ICD-10-CM | POA: Diagnosis not present

## 2018-05-13 DIAGNOSIS — E1122 Type 2 diabetes mellitus with diabetic chronic kidney disease: Secondary | ICD-10-CM | POA: Diagnosis not present

## 2018-05-13 DIAGNOSIS — Z8249 Family history of ischemic heart disease and other diseases of the circulatory system: Secondary | ICD-10-CM | POA: Insufficient documentation

## 2018-05-13 DIAGNOSIS — D242 Benign neoplasm of left breast: Secondary | ICD-10-CM | POA: Diagnosis not present

## 2018-05-13 DIAGNOSIS — N6489 Other specified disorders of breast: Secondary | ICD-10-CM | POA: Diagnosis not present

## 2018-05-13 DIAGNOSIS — I120 Hypertensive chronic kidney disease with stage 5 chronic kidney disease or end stage renal disease: Secondary | ICD-10-CM | POA: Diagnosis not present

## 2018-05-13 DIAGNOSIS — N6022 Fibroadenosis of left breast: Secondary | ICD-10-CM

## 2018-05-13 DIAGNOSIS — E119 Type 2 diabetes mellitus without complications: Secondary | ICD-10-CM | POA: Diagnosis not present

## 2018-05-13 DIAGNOSIS — E7800 Pure hypercholesterolemia, unspecified: Secondary | ICD-10-CM | POA: Insufficient documentation

## 2018-05-13 DIAGNOSIS — M1990 Unspecified osteoarthritis, unspecified site: Secondary | ICD-10-CM | POA: Insufficient documentation

## 2018-05-13 DIAGNOSIS — N186 End stage renal disease: Secondary | ICD-10-CM | POA: Diagnosis not present

## 2018-05-13 DIAGNOSIS — N6082 Other benign mammary dysplasias of left breast: Secondary | ICD-10-CM | POA: Diagnosis not present

## 2018-05-13 DIAGNOSIS — F329 Major depressive disorder, single episode, unspecified: Secondary | ICD-10-CM | POA: Diagnosis not present

## 2018-05-13 DIAGNOSIS — Z79899 Other long term (current) drug therapy: Secondary | ICD-10-CM | POA: Diagnosis not present

## 2018-05-13 DIAGNOSIS — K219 Gastro-esophageal reflux disease without esophagitis: Secondary | ICD-10-CM | POA: Insufficient documentation

## 2018-05-13 DIAGNOSIS — Z90710 Acquired absence of both cervix and uterus: Secondary | ICD-10-CM | POA: Diagnosis not present

## 2018-05-13 DIAGNOSIS — M549 Dorsalgia, unspecified: Secondary | ICD-10-CM | POA: Diagnosis not present

## 2018-05-13 DIAGNOSIS — I10 Essential (primary) hypertension: Secondary | ICD-10-CM | POA: Diagnosis not present

## 2018-05-13 DIAGNOSIS — R928 Other abnormal and inconclusive findings on diagnostic imaging of breast: Secondary | ICD-10-CM | POA: Diagnosis not present

## 2018-05-13 DIAGNOSIS — N6012 Diffuse cystic mastopathy of left breast: Secondary | ICD-10-CM | POA: Diagnosis not present

## 2018-05-13 HISTORY — PX: BREAST LUMPECTOMY WITH RADIOACTIVE SEED LOCALIZATION: SHX6424

## 2018-05-13 LAB — GLUCOSE, CAPILLARY
Glucose-Capillary: 152 mg/dL — ABNORMAL HIGH (ref 70–99)
Glucose-Capillary: 125 mg/dL — ABNORMAL HIGH (ref 70–99)

## 2018-05-13 SURGERY — BREAST LUMPECTOMY WITH RADIOACTIVE SEED LOCALIZATION
Anesthesia: General | Site: Breast | Laterality: Left

## 2018-05-13 MED ORDER — MEPERIDINE HCL 50 MG/ML IJ SOLN: 6.2500 mg | INTRAMUSCULAR | Status: DC | PRN

## 2018-05-13 MED ORDER — CEFAZOLIN SODIUM-DEXTROSE 2-4 GM/100ML-% IV SOLN
2.0000 g | INTRAVENOUS | Status: AC
  Administered 2018-05-13: 2 g via INTRAVENOUS

## 2018-05-13 MED ORDER — BUPIVACAINE-EPINEPHRINE (PF) 0.25% -1:200000 IJ SOLN
INTRAMUSCULAR | Status: AC
  Filled 2018-05-13: qty 30

## 2018-05-13 MED ORDER — MIDAZOLAM HCL 2 MG/2ML IJ SOLN
INTRAMUSCULAR | Status: AC
  Filled 2018-05-13: qty 2

## 2018-05-13 MED ORDER — KETOROLAC TROMETHAMINE 30 MG/ML IJ SOLN: 30.0000 mg | Freq: Once | INTRAMUSCULAR | Status: DC | PRN

## 2018-05-13 MED ORDER — ROCURONIUM BROMIDE 50 MG/5ML IV SOSY
PREFILLED_SYRINGE | INTRAVENOUS | Status: AC
  Filled 2018-05-13: qty 5

## 2018-05-13 MED ORDER — CELECOXIB 200 MG PO CAPS
ORAL_CAPSULE | ORAL | Status: AC
  Administered 2018-05-13: 200 mg via ORAL
  Filled 2018-05-13: qty 1

## 2018-05-13 MED ORDER — LIDOCAINE 2% (20 MG/ML) 5 ML SYRINGE
INTRAMUSCULAR | Status: AC
  Filled 2018-05-13: qty 5

## 2018-05-13 MED ORDER — GABAPENTIN 300 MG PO CAPS
ORAL_CAPSULE | ORAL | Status: AC
  Administered 2018-05-13: 300 mg via ORAL
  Filled 2018-05-13: qty 1

## 2018-05-13 MED ORDER — PHENYLEPHRINE 40 MCG/ML (10ML) SYRINGE FOR IV PUSH (FOR BLOOD PRESSURE SUPPORT)
PREFILLED_SYRINGE | INTRAVENOUS | Status: AC
  Filled 2018-05-13: qty 10

## 2018-05-13 MED ORDER — CELECOXIB 200 MG PO CAPS
200.0000 mg | ORAL_CAPSULE | ORAL | Status: AC
  Administered 2018-05-13: 200 mg via ORAL

## 2018-05-13 MED ORDER — HYDROCODONE-ACETAMINOPHEN 5-325 MG PO TABS: 1.0000 | ORAL_TABLET | Freq: Four times a day (QID) | ORAL | 0 refills | Status: DC | PRN

## 2018-05-13 MED ORDER — LACTATED RINGERS IV SOLN
INTRAVENOUS | Status: DC
  Administered 2018-05-13: 13:00:00 via INTRAVENOUS

## 2018-05-13 MED ORDER — ONDANSETRON HCL 4 MG/2ML IJ SOLN
INTRAMUSCULAR | Status: AC
  Filled 2018-05-13: qty 8

## 2018-05-13 MED ORDER — GLYCOPYRROLATE PF 0.2 MG/ML IJ SOSY
PREFILLED_SYRINGE | INTRAMUSCULAR | Status: DC | PRN
  Administered 2018-05-13: .2 mg via INTRAVENOUS
  Administered 2018-05-13: .1 mg via INTRAVENOUS

## 2018-05-13 MED ORDER — PROPOFOL 10 MG/ML IV BOLUS
INTRAVENOUS | Status: AC
  Filled 2018-05-13: qty 20

## 2018-05-13 MED ORDER — DEXAMETHASONE SODIUM PHOSPHATE 10 MG/ML IJ SOLN
INTRAMUSCULAR | Status: AC
  Filled 2018-05-13: qty 4

## 2018-05-13 MED ORDER — CEFAZOLIN SODIUM-DEXTROSE 2-4 GM/100ML-% IV SOLN
INTRAVENOUS | Status: AC
  Filled 2018-05-13: qty 100

## 2018-05-13 MED ORDER — 0.9 % SODIUM CHLORIDE (POUR BTL) OPTIME
TOPICAL | Status: DC | PRN
  Administered 2018-05-13: 1000 mL

## 2018-05-13 MED ORDER — FENTANYL CITRATE (PF) 250 MCG/5ML IJ SOLN
INTRAMUSCULAR | Status: AC
  Filled 2018-05-13: qty 5

## 2018-05-13 MED ORDER — BUPIVACAINE-EPINEPHRINE 0.25% -1:200000 IJ SOLN
INTRAMUSCULAR | Status: DC | PRN
  Administered 2018-05-13: 20 mL

## 2018-05-13 MED ORDER — CHLORHEXIDINE GLUCONATE CLOTH 2 % EX PADS: 6.0000 | MEDICATED_PAD | Freq: Once | CUTANEOUS | Status: DC

## 2018-05-13 MED ORDER — OXYCODONE HCL 5 MG/5ML PO SOLN: 5.0000 mg | Freq: Once | ORAL | Status: DC | PRN

## 2018-05-13 MED ORDER — ONDANSETRON HCL 4 MG/2ML IJ SOLN: 4.0000 mg | Freq: Once | INTRAMUSCULAR | Status: DC | PRN

## 2018-05-13 MED ORDER — LACTATED RINGERS IV SOLN: INTRAVENOUS | Status: DC

## 2018-05-13 MED ORDER — ONDANSETRON HCL 4 MG/2ML IJ SOLN
INTRAMUSCULAR | Status: AC
  Filled 2018-05-13: qty 2

## 2018-05-13 MED ORDER — PROPOFOL 10 MG/ML IV BOLUS
INTRAVENOUS | Status: DC | PRN
  Administered 2018-05-13: 200 mg via INTRAVENOUS

## 2018-05-13 MED ORDER — DEXAMETHASONE SODIUM PHOSPHATE 10 MG/ML IJ SOLN
INTRAMUSCULAR | Status: AC
  Filled 2018-05-13: qty 1

## 2018-05-13 MED ORDER — LIDOCAINE 2% (20 MG/ML) 5 ML SYRINGE
INTRAMUSCULAR | Status: DC | PRN
  Administered 2018-05-13: 100 mg via INTRAVENOUS

## 2018-05-13 MED ORDER — FENTANYL CITRATE (PF) 250 MCG/5ML IJ SOLN
INTRAMUSCULAR | Status: DC | PRN
  Administered 2018-05-13: 100 ug via INTRAVENOUS

## 2018-05-13 MED ORDER — OXYCODONE HCL 5 MG PO TABS: 5.0000 mg | ORAL_TABLET | Freq: Once | ORAL | Status: DC | PRN

## 2018-05-13 MED ORDER — PHENYLEPHRINE 40 MCG/ML (10ML) SYRINGE FOR IV PUSH (FOR BLOOD PRESSURE SUPPORT)
PREFILLED_SYRINGE | INTRAVENOUS | Status: DC | PRN
  Administered 2018-05-13 (×3): 80 ug via INTRAVENOUS

## 2018-05-13 MED ORDER — EPHEDRINE SULFATE-NACL 50-0.9 MG/10ML-% IV SOSY
PREFILLED_SYRINGE | INTRAVENOUS | Status: DC | PRN
  Administered 2018-05-13: 10 mg via INTRAVENOUS
  Administered 2018-05-13: 15 mg via INTRAVENOUS
  Administered 2018-05-13 (×2): 10 mg via INTRAVENOUS

## 2018-05-13 MED ORDER — FENTANYL CITRATE (PF) 100 MCG/2ML IJ SOLN: 25.0000 ug | INTRAMUSCULAR | Status: DC | PRN

## 2018-05-13 MED ORDER — GLYCOPYRROLATE PF 0.2 MG/ML IJ SOSY
PREFILLED_SYRINGE | INTRAMUSCULAR | Status: AC
  Filled 2018-05-13: qty 2

## 2018-05-13 MED ORDER — ALBUMIN HUMAN 5 % IV SOLN
INTRAVENOUS | Status: DC | PRN
  Administered 2018-05-13: 14:00:00 via INTRAVENOUS

## 2018-05-13 MED ORDER — EPHEDRINE 5 MG/ML INJ
INTRAVENOUS | Status: AC
  Filled 2018-05-13: qty 10

## 2018-05-13 MED ORDER — ACETAMINOPHEN 500 MG PO TABS
ORAL_TABLET | ORAL | Status: AC
  Administered 2018-05-13: 1000 mg via ORAL
  Filled 2018-05-13: qty 2

## 2018-05-13 MED ORDER — GABAPENTIN 300 MG PO CAPS
300.0000 mg | ORAL_CAPSULE | ORAL | Status: AC
  Administered 2018-05-13: 300 mg via ORAL

## 2018-05-13 MED ORDER — ACETAMINOPHEN 325 MG PO TABS: 325.0000 mg | ORAL_TABLET | ORAL | Status: DC | PRN

## 2018-05-13 MED ORDER — ACETAMINOPHEN 500 MG PO TABS
1000.0000 mg | ORAL_TABLET | ORAL | Status: AC
  Administered 2018-05-13: 1000 mg via ORAL

## 2018-05-13 MED ORDER — ACETAMINOPHEN 160 MG/5ML PO SOLN: 325.0000 mg | ORAL | Status: DC | PRN

## 2018-05-13 SURGICAL SUPPLY — 40 items
APPLIER CLIP 9.375 MED OPEN (MISCELLANEOUS)
BINDER BREAST LRG (GAUZE/BANDAGES/DRESSINGS) ×3 IMPLANT
BINDER BREAST XLRG (GAUZE/BANDAGES/DRESSINGS) IMPLANT
BLADE SURG 15 STRL LF DISP TIS (BLADE) ×1 IMPLANT
BLADE SURG 15 STRL SS (BLADE) ×2
CANISTER SUCT 3000ML PPV (MISCELLANEOUS) ×3 IMPLANT
CHLORAPREP W/TINT 26ML (MISCELLANEOUS) ×3 IMPLANT
CLIP APPLIE 9.375 MED OPEN (MISCELLANEOUS) IMPLANT
COVER PROBE W GEL 5X96 (DRAPES) ×3 IMPLANT
COVER SURGICAL LIGHT HANDLE (MISCELLANEOUS) ×3 IMPLANT
COVER WAND RF STERILE (DRAPES) IMPLANT
DERMABOND ADVANCED (GAUZE/BANDAGES/DRESSINGS) ×2
DERMABOND ADVANCED .7 DNX12 (GAUZE/BANDAGES/DRESSINGS) ×1 IMPLANT
DEVICE DUBIN SPECIMEN MAMMOGRA (MISCELLANEOUS) ×3 IMPLANT
DRAPE CHEST BREAST 15X10 FENES (DRAPES) ×3 IMPLANT
DRAPE UTILITY XL STRL (DRAPES) ×3 IMPLANT
ELECT COATED BLADE 2.86 ST (ELECTRODE) ×3 IMPLANT
ELECT REM PT RETURN 9FT ADLT (ELECTROSURGICAL) ×3
ELECTRODE REM PT RTRN 9FT ADLT (ELECTROSURGICAL) ×1 IMPLANT
GAUZE SPONGE 4X4 12PLY STRL (GAUZE/BANDAGES/DRESSINGS) ×3 IMPLANT
GLOVE BIO SURGEON STRL SZ7.5 (GLOVE) ×6 IMPLANT
GOWN STRL REUS W/ TWL LRG LVL3 (GOWN DISPOSABLE) ×2 IMPLANT
GOWN STRL REUS W/TWL LRG LVL3 (GOWN DISPOSABLE) ×4
KIT BASIN OR (CUSTOM PROCEDURE TRAY) ×3 IMPLANT
KIT MARKER MARGIN INK (KITS) ×3 IMPLANT
LIGHT WAVEGUIDE WIDE FLAT (MISCELLANEOUS) IMPLANT
NEEDLE HYPO 25GX1X1/2 BEV (NEEDLE) ×3 IMPLANT
NS IRRIG 1000ML POUR BTL (IV SOLUTION) ×3 IMPLANT
PACK SURGICAL SETUP 50X90 (CUSTOM PROCEDURE TRAY) ×3 IMPLANT
PENCIL BUTTON HOLSTER BLD 10FT (ELECTRODE) ×3 IMPLANT
SPONGE LAP 18X18 X RAY DECT (DISPOSABLE) ×3 IMPLANT
SUT MNCRL AB 4-0 PS2 18 (SUTURE) ×3 IMPLANT
SUT SILK 2 0 SH (SUTURE) IMPLANT
SUT VIC AB 3-0 SH 18 (SUTURE) ×3 IMPLANT
SYR BULB 3OZ (MISCELLANEOUS) ×3 IMPLANT
SYR CONTROL 10ML LL (SYRINGE) ×3 IMPLANT
TOWEL GREEN STERILE FF (TOWEL DISPOSABLE) ×3 IMPLANT
TUBE CONNECTING 12'X1/4 (SUCTIONS) ×1
TUBE CONNECTING 12X1/4 (SUCTIONS) ×2 IMPLANT
YANKAUER SUCT BULB TIP NO VENT (SUCTIONS) ×3 IMPLANT

## 2018-05-13 NOTE — Interval H&P Note (Signed)
History and Physical Interval Note:  05/13/2018 1:10 PM  Tammy Mcknight  has presented today for surgery, with the diagnosis of LEFT BREAST COMPLEX SCLEROSING LESION  The various methods of treatment have been discussed with the patient and family. After consideration of risks, benefits and other options for treatment, the patient has consented to  Procedure(s): LEFT BREAST LUMPECTOMY WITH RADIOACTIVE SEED LOCALIZATION (Left) as a surgical intervention .  The patient's history has been reviewed, patient examined, no change in status, stable for surgery.  I have reviewed the patient's chart and labs.  Questions were answered to the patient's satisfaction.     [name]

## 2018-05-13 NOTE — Anesthesia Postprocedure Evaluation (Signed)
Anesthesia Post Note  Patient: Tammy Mcknight  Procedure(s) Performed: LEFT BREAST LUMPECTOMY WITH RADIOACTIVE SEED LOCALIZATION (Left Breast)     Patient location during evaluation: PACU Anesthesia Type: General Level of consciousness: awake Pain management: pain level controlled Vital Signs Assessment: post-procedure vital signs reviewed and stable Respiratory status: spontaneous breathing Cardiovascular status: stable Postop Assessment: no apparent nausea or vomiting Anesthetic complications: no    Last Vitals:  Vitals:   05/13/18 1523 05/13/18 1539  BP: (!) 153/93 (!) 167/87  Pulse: 85 83  Resp: 13 10  Temp:    SpO2: 97% 92%    Last Pain:  Vitals:   05/13/18 1539  TempSrc:   PainSc: 0-No pain   Pain Goal: Patients Stated Pain Goal: 3 (05/13/18 1157)               Huston Foley

## 2018-05-13 NOTE — Op Note (Signed)
05/13/2018  2:26 PM  PATIENT:  Tammy Mcknight  62 y.o. female  PRE-OPERATIVE DIAGNOSIS:  LEFT BREAST COMPLEX SCLEROSING LESION  POST-OPERATIVE DIAGNOSIS:  LEFT BREAST COMPLEX SCLEROSING LESION  PROCEDURE:  Procedure(s): LEFT BREAST LUMPECTOMY WITH RADIOACTIVE SEED LOCALIZATION (Left)  SURGEON:  Surgeon(s) and Role:    Jovita Kussmaul, MD - Primary  PHYSICIAN ASSISTANT:   ASSISTANTS: none   ANESTHESIA:   local and general  EBL:  minimal   BLOOD ADMINISTERED:none  DRAINS: none   LOCAL MEDICATIONS USED:  MARCAINE     SPECIMEN:  Source of Specimen:  left breast tissue  DISPOSITION OF SPECIMEN:  PATHOLOGY  COUNTS:  YES  TOURNIQUET:  * No tourniquets in log *  DICTATION: .Dragon Dictation   After informed consent was obtained the patient was brought to the operating room and placed in the supine position on the operating table.  After adequate induction of general anesthesia the patient's left breast was prepped with ChloraPrep, allowed to dry, and draped in usual sterile manner.  An appropriate timeout was performed.  Previously an I-125 seed was placed in the upper outer quadrant of the left breast to mark an area of a complex sclerosing lesion.  The neoprobe was set to I-125 in the area of radioactivity was readily identified.  The area around this was infiltrated with quarter percent Marcaine.  A curvilinear incision was made along the upper outer edge of the areola of the left breast.  The incision was carried through the skin and subcutaneous tissue sharply with electrocautery.  Dissection was then carried between the breast tissue and the subcutaneous fat into the upper outer quadrant.  Once we were well beyond the area where the seed was then a circular portion of breast tissue was excised sharply around the radioactive seed while checking the area of radioactivity frequently.  Once the specimen was removed it was oriented with the appropriate paint colors.  A specimen  radiograph was obtained that showed the clip and seed to be near the center of the specimen.  The specimen was then sent to pathology for further evaluation.  Hemostasis was achieved using the Bovie electrocautery.  The wound was irrigated with saline and infiltrated with more quarter percent Marcaine.  The deep layer of the wound was then closed with layers of interrupted 3-0 Vicryl stitches.  The skin was then closed with interrupted 4-0 Monocryl subcuticular stitches.  Dermabond dressings were applied.  The patient tolerated the procedure well.  At the end of the case all needle sponge and instrument counts were correct.  The patient was then awakened and taken recovery in stable condition.  PLAN OF CARE: Discharge to home after PACU  PATIENT DISPOSITION:  PACU - hemodynamically stable.   Delay start of Pharmacological VTE agent (>24hrs) due to surgical blood loss or risk of bleeding: not applicable

## 2018-05-13 NOTE — Transfer of Care (Signed)
Immediate Anesthesia Transfer of Care Note  Patient: Tammy Mcknight  Procedure(s) Performed: LEFT BREAST LUMPECTOMY WITH RADIOACTIVE SEED LOCALIZATION (Left Breast)  Patient Location: PACU  Anesthesia Type:General  Level of Consciousness: awake, alert , oriented and patient cooperative  Airway & Oxygen Therapy: Patient Spontanous Breathing and Patient connected to nasal cannula oxygen  Post-op Assessment: Report given to RN and Post -op Vital signs reviewed and stable  Post vital signs: Reviewed and stable  Last Vitals:  Vitals Value Taken Time  BP 131/75 05/13/2018  2:37 PM  Temp    Pulse 89 05/13/2018  2:38 PM  Resp 14 05/13/2018  2:38 PM  SpO2 91 % 05/13/2018  2:38 PM  Vitals shown include unvalidated device data.  Last Pain:  Vitals:   05/13/18 1157  TempSrc:   PainSc: 0-No pain      Patients Stated Pain Goal: 3 (43/60/67 7034)  Complications: No apparent anesthesia complications

## 2018-05-13 NOTE — H&P (Signed)
Tammy Mcknight  Location: [facility] Patient #: [id number] DOB: 1956-04-18 Married / Language: English / Race: White Female   History of Present Illness  The patient is a 62 year old female who presents with a breast mass. We are asked to see the patient in consultation by Dr. Lajean Manes to evaluate her for a complex sclerosing lesion in the left breast. The patient is a 62 year old white female who recently went for a routine screening mammogram. She was found to have a 3.4cm abnormality in the UOQ of the left breast. This was biopsied and came back as a complex sclerosing lesion. She denies any breast pain or discharge from the nipple. She has no family history of breast cancer. She does report elevated calcium levels and an unspecified autoimmune rash on her skin. She has been told she needs a thyroidectomy. I can find very few of these records on Care Everywhere.   Past Surgical History  Breast Biopsy  Bilateral. Foot Surgery  Right. 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 Citalopram Hydrobromide (40MG  Tablet, Oral) Active. Valsartan (160MG  Tablet, Oral) Active. Labetalol HCl (200MG  Tablet, Oral) Active.  Social History  Caffeine use  Tea.  Family History  Arthritis  Father. Bleeding disorder  Family Members In General. Cerebrovascular Accident  Family Members In General. Depression  Family Members In General. Heart Disease  Mother. Hypertension  Father. Migraine Headache  Mother.  Pregnancy / Birth History  Age of menopause  71-50 Gravida  2 Maternal age  65-25 Para  2  Other Problems Arthritis  Back Pain  Cerebrovascular Accident  Chronic Renal Failure Syndrome  Depression  Diabetes Mellitus  Gastroesophageal Reflux Disease  High  blood pressure  Hypercholesterolemia  Lump In Breast  Thyroid Disease  Transfusion history     Review of Systems  General Not Present- Appetite Loss, Chills, Fatigue, Fever, Night Sweats, Weight Gain and Weight Loss. Note: All other systems negative (unless as noted in HPI & included Review of Systems) Skin Not Present- Change in Wart/Mole, Dryness, Hives, Jaundice, New Lesions, Non-Healing Wounds, Rash and Ulcer. HEENT Not Present- Earache, Hearing Loss, Hoarseness, Nose Bleed, Oral Ulcers, Ringing in the Ears, Seasonal Allergies, Sinus Pain, Sore Throat, Visual Disturbances, Wears glasses/contact lenses and Yellow Eyes. Respiratory Not Present- Bloody sputum, Chronic Cough, Difficulty Breathing, Snoring and Wheezing. Breast Not Present- Breast Mass, Breast Pain, Nipple Discharge and Skin Changes. Cardiovascular Not Present- Chest Pain, Difficulty Breathing Lying Down, Leg Cramps, Palpitations, Rapid Heart Rate, Shortness of Breath and Swelling of Extremities. Gastrointestinal Not Present- Abdominal Pain, Bloating, Bloody Stool, Change in Bowel Habits, Chronic diarrhea, Constipation, Difficulty Swallowing, Excessive gas, Gets full quickly at meals, Hemorrhoids, Indigestion, Nausea, Rectal Pain and Vomiting. Female Genitourinary Not Present- Frequency, Nocturia, Painful Urination, Pelvic Pain and Urgency. Musculoskeletal Not Present- Back Pain, Joint Pain, Joint Stiffness, Muscle Pain, Muscle Weakness and Swelling of Extremities. Neurological Not Present- Decreased Memory, Fainting, Headaches, Numbness, Seizures, Tingling, Tremor, Trouble walking and Weakness. Psychiatric Not Present- Anxiety, Bipolar, Change in Sleep Pattern, Depression, Fearful and Frequent crying. Endocrine Not Present- Cold Intolerance, Excessive Hunger, Hair Changes, Heat Intolerance, Hot flashes and New Diabetes. Hematology Not Present- Blood Thinners, Easy Bruising, Excessive bleeding, Gland problems, HIV and  Persistent Infections.  Vitals  Weight: 168.38 lb Height: 67in Body Surface Area: 1.88  m Body Mass Index: 26.37 kg/m  Temp.: 98.52F(Tympanic)  Pulse: 75 (Regular)  Resp.: 18 (Unlabored)  P.OX: 99% (Room air) BP: 122/82 (Sitting, Left Arm, Standard)       Physical Exam  General Mental Status-Alert. General Appearance-Consistent with stated age. Hydration-Well hydrated. Voice-Normal.  Integumentary Note: She has a papular red rash on all her skin surfaces   Head and Neck Head-normocephalic, atraumatic with no lesions or palpable masses. Trachea-midline. Thyroid Gland Characteristics - normal size and consistency.  Eye Eyeball - Bilateral-Extraocular movements intact. Sclera/Conjunctiva - Bilateral-No scleral icterus.  Chest and Lung Exam Chest and lung exam reveals -quiet, even and easy respiratory effort with no use of accessory muscles and on auscultation, normal breath sounds, no adventitious sounds and normal vocal resonance. Inspection Chest Wall - Normal. Back - normal.  Breast Note: there is no palpable mass in either breast. there is no palpable axillary, supraclavicular, or cervical lymphadenopathy   Cardiovascular Cardiovascular examination reveals -normal heart sounds, regular rate and rhythm with no murmurs and normal pedal pulses bilaterally.  Abdomen Inspection Inspection of the abdomen reveals - No Hernias. Skin - Scar - no surgical scars. Palpation/Percussion Palpation and Percussion of the abdomen reveal - Soft, Non Tender, No Rebound tenderness, No Rigidity (guarding) and No hepatosplenomegaly. Auscultation Auscultation of the abdomen reveals - Bowel sounds normal.  Neurologic Neurologic evaluation reveals -alert and oriented x 3 with no impairment of recent or remote memory. Mental Status-Normal.  Musculoskeletal Normal Exam - Left-Upper Extremity Strength Normal and Lower Extremity Strength  Normal. Normal Exam - Right-Upper Extremity Strength Normal and Lower Extremity Strength Normal.  Lymphatic Head & Neck  General Head & Neck Lymphatics: Bilateral - Description - Normal. Axillary  General Axillary Region: Bilateral - Description - Normal. Tenderness - Non Tender. Femoral & Inguinal  Generalized Femoral & Inguinal Lymphatics: Bilateral - Description - Normal. Tenderness - Non Tender.    Assessment & Plan SCLEROSING ADENOSIS OF BREAST, LEFT (N60.22) Impression: The patient appears to have a 3.4 cm area of complex sclerosing lesion in the upper outer left breast. Although this is a benign lesion and the appearance on mammogram and the chance of missing a more significant lesion all favor excision of the area. I have discussed with her in detail the risks and benefits of the operation as well as some of the technical aspects and she understands and wishes to proceed. I will plan for a left breast radioactive seed localized lumpectomy. She also reports having an unknown autoimmune disease as well as possible hypercalcemia. We will try to investigate this information more fully and see if we can make sense of the story. Current Plans Pt Education - Breast Diseases: discussed with patient and provided information.

## 2018-05-14 ENCOUNTER — Encounter (HOSPITAL_COMMUNITY): Payer: Self-pay | Admitting: General Surgery

## 2018-05-21 DIAGNOSIS — G6182 Multifocal motor neuropathy: Secondary | ICD-10-CM | POA: Diagnosis not present

## 2018-05-21 DIAGNOSIS — R29898 Other symptoms and signs involving the musculoskeletal system: Secondary | ICD-10-CM | POA: Diagnosis not present

## 2018-05-22 DIAGNOSIS — H538 Other visual disturbances: Secondary | ICD-10-CM | POA: Diagnosis not present

## 2018-05-28 DIAGNOSIS — N183 Chronic kidney disease, stage 3 (moderate): Secondary | ICD-10-CM | POA: Diagnosis not present

## 2018-05-28 DIAGNOSIS — I10 Essential (primary) hypertension: Secondary | ICD-10-CM | POA: Diagnosis not present

## 2018-05-28 DIAGNOSIS — N390 Urinary tract infection, site not specified: Secondary | ICD-10-CM | POA: Diagnosis not present

## 2018-05-28 DIAGNOSIS — R809 Proteinuria, unspecified: Secondary | ICD-10-CM | POA: Diagnosis not present

## 2018-05-28 DIAGNOSIS — E213 Hyperparathyroidism, unspecified: Secondary | ICD-10-CM | POA: Diagnosis not present

## 2018-05-28 DIAGNOSIS — E559 Vitamin D deficiency, unspecified: Secondary | ICD-10-CM | POA: Diagnosis not present

## 2018-06-10 DIAGNOSIS — E559 Vitamin D deficiency, unspecified: Secondary | ICD-10-CM | POA: Diagnosis not present

## 2018-06-10 DIAGNOSIS — I10 Essential (primary) hypertension: Secondary | ICD-10-CM | POA: Diagnosis not present

## 2018-06-19 DIAGNOSIS — I10 Essential (primary) hypertension: Secondary | ICD-10-CM | POA: Diagnosis not present

## 2018-06-19 DIAGNOSIS — E559 Vitamin D deficiency, unspecified: Secondary | ICD-10-CM | POA: Diagnosis not present

## 2018-06-24 DIAGNOSIS — J042 Acute laryngotracheitis: Secondary | ICD-10-CM | POA: Diagnosis not present

## 2018-07-03 DIAGNOSIS — I10 Essential (primary) hypertension: Secondary | ICD-10-CM | POA: Diagnosis not present

## 2018-07-03 DIAGNOSIS — N183 Chronic kidney disease, stage 3 (moderate): Secondary | ICD-10-CM | POA: Diagnosis not present

## 2018-07-03 DIAGNOSIS — N390 Urinary tract infection, site not specified: Secondary | ICD-10-CM | POA: Diagnosis not present

## 2018-07-19 DIAGNOSIS — N200 Calculus of kidney: Secondary | ICD-10-CM | POA: Diagnosis not present

## 2018-07-25 DIAGNOSIS — E119 Type 2 diabetes mellitus without complications: Secondary | ICD-10-CM | POA: Diagnosis not present

## 2018-07-25 DIAGNOSIS — E782 Mixed hyperlipidemia: Secondary | ICD-10-CM | POA: Diagnosis not present

## 2018-07-25 DIAGNOSIS — I10 Essential (primary) hypertension: Secondary | ICD-10-CM | POA: Diagnosis not present

## 2018-07-25 DIAGNOSIS — F3340 Major depressive disorder, recurrent, in remission, unspecified: Secondary | ICD-10-CM | POA: Diagnosis not present

## 2018-08-06 DIAGNOSIS — N183 Chronic kidney disease, stage 3 (moderate): Secondary | ICD-10-CM | POA: Diagnosis not present

## 2018-08-06 DIAGNOSIS — E559 Vitamin D deficiency, unspecified: Secondary | ICD-10-CM | POA: Diagnosis not present

## 2018-08-06 DIAGNOSIS — I10 Essential (primary) hypertension: Secondary | ICD-10-CM | POA: Diagnosis not present

## 2018-08-29 DIAGNOSIS — Z471 Aftercare following joint replacement surgery: Secondary | ICD-10-CM | POA: Diagnosis not present

## 2018-08-29 DIAGNOSIS — Z96641 Presence of right artificial hip joint: Secondary | ICD-10-CM | POA: Diagnosis not present

## 2018-09-01 DIAGNOSIS — Z96641 Presence of right artificial hip joint: Secondary | ICD-10-CM | POA: Insufficient documentation

## 2018-09-11 DIAGNOSIS — I10 Essential (primary) hypertension: Secondary | ICD-10-CM | POA: Diagnosis not present

## 2018-09-11 DIAGNOSIS — N183 Chronic kidney disease, stage 3 (moderate): Secondary | ICD-10-CM | POA: Diagnosis not present

## 2018-09-11 DIAGNOSIS — N390 Urinary tract infection, site not specified: Secondary | ICD-10-CM | POA: Diagnosis not present

## 2018-09-11 DIAGNOSIS — E559 Vitamin D deficiency, unspecified: Secondary | ICD-10-CM | POA: Diagnosis not present

## 2018-09-30 ENCOUNTER — Telehealth: Payer: Self-pay

## 2018-09-30 NOTE — Telephone Encounter (Signed)
Pt states she will be able to use her friends computer to do her Virtual Visit. Email has been confirmed.

## 2018-09-30 NOTE — Telephone Encounter (Signed)
Pt has called back in and is asking that the email be sent to her friends email address which is [email or web address]

## 2018-09-30 NOTE — Telephone Encounter (Signed)
Tammy Mcknight spoke with patient on Thursday last week but patient asked that she be called back on Monday (today). I called and spoke with patient and discussed the virtual visit and what she would need. She states that she does not have any of the devices needed for a virtual visit but she has a friend that has a Teaching laboratory technician. I asked if the computer has a camera but she was not sure. She is going to contact her friend and then call us back. When patient calls back, if she can proceed with virtual visit, please get her email address (or her friends email address, however she wants to do it). If she does not have a camera, she will need to reschedule for an in office visit.

## 2018-09-30 NOTE — Telephone Encounter (Signed)
Great! I have sent the email invitation to the email address provided. Patient is aware to call if she has any questions or problems with the set up.

## 2018-10-01 ENCOUNTER — Encounter: Payer: Self-pay | Admitting: *Deleted

## 2018-10-01 ENCOUNTER — Encounter: Payer: Self-pay | Admitting: Neurology

## 2018-10-01 ENCOUNTER — Ambulatory Visit (INDEPENDENT_AMBULATORY_CARE_PROVIDER_SITE_OTHER): Payer: PPO | Admitting: Neurology

## 2018-10-01 ENCOUNTER — Other Ambulatory Visit: Payer: Self-pay

## 2018-10-01 DIAGNOSIS — H814 Vertigo of central origin: Secondary | ICD-10-CM | POA: Diagnosis not present

## 2018-10-01 DIAGNOSIS — R42 Dizziness and giddiness: Secondary | ICD-10-CM | POA: Diagnosis not present

## 2018-10-01 NOTE — Progress Notes (Signed)
PATIENT: Tammy Mcknight DOB: Oct 10, 1955  Virtual Visit via video  I connected with Tammy Mcknight on 10/01/18 at  by video and verified that I am speaking with the correct person using two identifiers.   I discussed the limitations, risks, security and privacy concerns of performing an evaluation and management service by video and the availability of in person appointments. I also discussed with the patient that there may be a patient responsible charge related to this service. The patient expressed understanding and agreed to proceed.  HISTORICAL  Tammy Mcknight is a 63 year old female, seen in request by her primary care Dr. Hillis Range evaluation of dizziness, unsteady gait,  I have reviewed and summarized the referring note from the referring physician.  Also revealed multiple [facility] chart, patient was able to provide limited detail in her previous injury,  She fell off her porch while feeding the bird in 2014, when she awakened she felt confused, but was able to reach out to her husband to call for help, was initially treated at [facility], later was transferred to [facility], per record, she suffered a ruptured spleen, head injury, she received massive blood transfusion, also reported acute renal failure, per patient, during hospital stay she also suffered cardiac arrest  She also had a history of right hip replacement in May 2019,  She used to work in home health aide, 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   I reviewed outside record, MRI of brain in June 2017, right maxillary sinus is opacified with  chronic inflammatory changes, there also other evidence of chronic sinusitis, in left maxillary sinus.  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  There was no evidence of acute ischemic changes.   MRI of cervical spine showed degenerative changes, there was no significant canal or foraminal narrowing    Observations/Objective: I have reviewed problem lists, medications, allergies.  Awake alert oriented to history taking care of conversation, able to get up from seated position without difficulty, fairly steady gait,  Assessment and Plan: Reported history of head injury, cardiac arrest, presented with frequent dizzy spells,  Differentiation diagnosis include partial seizure, posterior circulation insufficiency  Complete evaluation with MRI of the brain  EEG    Follow Up Instructions:   3 months   I discussed the assessment and treatment plan with the patient. The patient was provided an opportunity to ask questions and all were answered. The patient agreed with the plan and demonstrated an understanding of the instructions.   The patient was advised to call back or seek an in-person evaluation if the symptoms worsen or if the condition fails to improve as anticipated.  I provided 30 minutes minutes of non-face-to-face time during this encounter.  REVIEW OF SYSTEMS: Full 14 system review of systems performed and notable only for as above All other review of systems were negative.  ALLERGIES: No Known Allergies  HOME MEDICATIONS: Current Outpatient Medications  Medication Sig Dispense  Refill  . acetaminophen (TYLENOL) 500 MG tablet Take 1,000 mg by mouth every 6 (six) hours as needed for moderate pain or headache.    Marland Kitchen atorvastatin (LIPITOR) 40 MG tablet  Take 40 mg by mouth daily.    Marland Kitchen CINNAMON PO Take 1 capsule by mouth 3 (three) times daily.    . citalopram (CELEXA) 40 MG tablet Take 40 mg by mouth daily.    Marland Kitchen estradiol (ESTRACE) 0.1 MG/GM vaginal cream Place 1 Applicatorful vaginally every Monday, Wednesday, and Friday.   5  . HYDROcodone-acetaminophen (NORCO/VICODIN) 5-325 MG tablet Take 1-2 tablets by mouth every 6 (six) hours as needed for moderate pain or severe pain. 15 tablet 0  . labetalol (NORMODYNE) 200 MG tablet Take 400 mg by mouth 2 (two) times daily.    . Probiotic Product (PROBIOTIC PO) Take 1 capsule by mouth daily.    . Turmeric (CURCUMIN 95 PO) Take 1 capsule by mouth daily.    . TURMERIC PO Take 1 capsule by mouth daily.    . valsartan (DIOVAN) 160 MG tablet Take 160 mg by mouth daily.     No current facility-administered medications for this visit.     PAST MEDICAL HISTORY: Past Medical History:  Diagnosis Date  . Acne rosacea   . Anoxic brain injury ([place])   . Arthritis   . Ataxia   . Bilateral carpal tunnel syndrome   . Breast lesion on mammography    Left  . Bulging lumbar disc   . Cardiac arrest due to trauma [facility])    From a fall off of her porch 2014  . Carpal tunnel syndrome of left wrist   . Cervical radiculopathy at C8   . 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 Depression   . 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 Dizziness due to old head injury   . Elevated liver enzymes   . Generalized osteoarthritis of multiple sites   . GERD (gastroesophageal reflux disease)   . Herniated nucleus pulposus, C4-5 left   . Hypercalcemia   . Hypertension   . Hyperthyroidism   . 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 Left hand weakness   . Lumbar radiculopathy   . Memory loss   . Menopausal syndrome   . 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 Pharyngeal dysphagia   . RLS (restless legs syndrome)   . Thyroid nodule     PAST SURGICAL HISTORY: Past Surgical History:  Procedure Laterality Date  .  ABDOMINAL HYSTERECTOMY    . BREAST LUMPECTOMY WITH RADIOACTIVE SEED LOCALIZATION Left 05/13/2018   Procedure: LEFT BREAST LUMPECTOMY WITH RADIOACTIVE SEED LOCALIZATION;  Surgeon: Jovita Kussmaul, MD;  Location: [place];  Service: General;  Laterality: Left;  . BREAST SURGERY     right benign lump  . HIP ARTHROPLASTY     Right  . SPLENECTOMY, TOTAL    . TONSILLECTOMY    . TONSILLECTOMY AND ADENOIDECTOMY    . TUBAL LIGATION      FAMILY HISTORY: Family History  Problem Relation Age of Onset  . Heart failure Mother   . Dementia Mother   . Hypertension Mother   . Fibromyalgia Mother   . Migraines Mother   . Lupus Mother   . Dementia Father   . Hypertension Father   . Leukemia Father     SOCIAL HISTORY:   Social History   Socioeconomic History  . Marital status: Married    Spouse name: Not  on file  . Number of children: Not on file  . Years of education: Not on file  . Highest education level: Not on file  Occupational History  . Not on file  Social Needs  . Financial resource strain: Not on file  . Food insecurity:    Worry: Not on file    Inability: Not on file  . Transportation needs:    Medical: Not on file    Non-medical: Not on file  Tobacco Use  . Smoking status: Never Smoker  . Smokeless tobacco: Never Used  Substance and Sexual Activity  . Alcohol use: Not Currently  . Drug use: Never  . Sexual activity: Not on file  Lifestyle  . Physical activity:    Days per week: Not on file    Minutes per session: Not on file  . Stress: Not on file  Relationships  . Social connections:    Talks on phone: Not on file    Gets together: Not on file    Attends religious service: Not on file    Active member of club or organization: Not on file    Attends meetings of clubs or organizations: Not on file    Relationship status: Not on file  . Intimate partner violence:    Fear of current or ex partner: Not on file    Emotionally abused: Not on file    Physically  abused: Not on file    Forced sexual activity: Not on file  Other Topics Concern  . Not on file  Social History Narrative  . Not on file    Marcial Pacas, M.D. Ph.D.  [facility] [address], [place], [place] [address] Ph: [phone] Fax: [phone]  CC: Algis Greenhouse, MD

## 2018-10-02 ENCOUNTER — Telehealth: Payer: Self-pay | Admitting: Neurology

## 2018-10-02 DIAGNOSIS — I10 Essential (primary) hypertension: Secondary | ICD-10-CM | POA: Diagnosis not present

## 2018-10-02 DIAGNOSIS — N390 Urinary tract infection, site not specified: Secondary | ICD-10-CM | POA: Diagnosis not present

## 2018-10-02 DIAGNOSIS — N183 Chronic kidney disease, stage 3 (moderate): Secondary | ICD-10-CM | POA: Diagnosis not present

## 2018-10-02 NOTE — Telephone Encounter (Signed)
Health team order sent to GI. No auth they will reach out to the pt to schedule.  °

## 2018-10-16 ENCOUNTER — Other Ambulatory Visit: Payer: Self-pay

## 2018-10-16 ENCOUNTER — Ambulatory Visit
Admission: RE | Admit: 2018-10-16 | Discharge: 2018-10-16 | Disposition: A | Discharge status: Home / Self Care | Payer: PPO | Source: Ambulatory Visit | Attending: Neurology | Admitting: Neurology

## 2018-10-16 DIAGNOSIS — H814 Vertigo of central origin: Secondary | ICD-10-CM | POA: Diagnosis not present

## 2018-10-21 ENCOUNTER — Telehealth: Payer: Self-pay | Admitting: Neurology

## 2018-10-21 NOTE — Telephone Encounter (Signed)
Please call patient, MRI of the brain showed small vessel disease, possible lacunar infarction in the right periventricular white matter.  No significant change compared to previous MRI in September 2019.  IMPRESSION: Abnormal MRI scan of the brain showing mild changes of chronic microvascular ischemia and possible remote age lacunar infarct in the right periventricular white matter.  Overall no significant change compared with previous MRI from 02/25/2018.

## 2018-10-22 NOTE — Telephone Encounter (Signed)
Spoke to the patient.  She was notified of the results below and verbalized understanding.

## 2018-10-23 ENCOUNTER — Telehealth: Payer: Self-pay | Admitting: Neurology

## 2018-10-23 NOTE — Telephone Encounter (Signed)
MRI of the brain: February 25, 2018,  showed small vessel disease, prominent bilateral parietal atrophy, no change compared to previous study in June 2017

## 2018-10-24 NOTE — Telephone Encounter (Signed)
error 

## 2018-10-25 DIAGNOSIS — N23 Unspecified renal colic: Secondary | ICD-10-CM | POA: Diagnosis not present

## 2018-10-25 DIAGNOSIS — N390 Urinary tract infection, site not specified: Secondary | ICD-10-CM | POA: Diagnosis not present

## 2018-12-02 ENCOUNTER — Telehealth: Payer: Self-pay | Admitting: Neurology

## 2018-12-02 ENCOUNTER — Other Ambulatory Visit: Payer: Self-pay

## 2018-12-02 ENCOUNTER — Ambulatory Visit (INDEPENDENT_AMBULATORY_CARE_PROVIDER_SITE_OTHER): Payer: PPO | Admitting: Neurology

## 2018-12-02 ENCOUNTER — Encounter: Payer: Self-pay | Admitting: Neurology

## 2018-12-02 VITALS — BP 147/81 | HR 72 | Temp 98.2°F | Ht 67.0 in | Wt 170.0 lb

## 2018-12-02 DIAGNOSIS — M6281 Muscle weakness (generalized): Secondary | ICD-10-CM | POA: Diagnosis not present

## 2018-12-02 DIAGNOSIS — R413 Other amnesia: Secondary | ICD-10-CM | POA: Diagnosis not present

## 2018-12-02 DIAGNOSIS — R29898 Other symptoms and signs involving the musculoskeletal system: Secondary | ICD-10-CM

## 2018-12-02 DIAGNOSIS — R410 Disorientation, unspecified: Secondary | ICD-10-CM | POA: Insufficient documentation

## 2018-12-02 MED ORDER — LAMOTRIGINE 25 MG PO TABS: ORAL_TABLET | ORAL | 0 refills | Status: DC

## 2018-12-02 MED ORDER — LAMOTRIGINE 100 MG PO TABS: 100.0000 mg | ORAL_TABLET | Freq: Two times a day (BID) | ORAL | 11 refills | Status: DC

## 2018-12-02 NOTE — Progress Notes (Signed)
PATIENT: Tammy Mcknight DOB: 12/01/1955  HISTORICAL  Tammy Mcknight is a 63 year old female, seen in request by her primary care Dr. Hillis Range evaluation of dizziness, unsteady gait,  I have reviewed and summarized the referring note from the referring physician.  Also revealed multiple [facility] chart, patient was able to provide limited detail in her previous injury,  She fell off her porch while feeding the bird in 2014, when she awakened she felt confused, but was able to reach out to her husband to call for help, was initially treated at [facility], later was transferred to [facility], per record, she suffered a ruptured spleen, head injury, she received massive blood transfusion, also reported acute renal failure, per patient, during hospital stay she also suffered cardiac arrest  She also had a history of right hip replacement in May 2019,  She used to work in home health aide, 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   I reviewed outside record, MRI of brain in June 2017, right maxillary sinus is opacified with chronic inflammatory changes, there also other evidence of chronic sinusitis, in left maxillary sinus.  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  There was no evidence of acute ischemic changes.   MRI of cervical spine showed degenerative changes, there was no significant  canal or foraminal narrowing  UPDATE December 02 2018 She is accompanied by her mother-in-law [name] at today's visit, who provides supplementary history,  Since falling off porch of 10 feet in 2014, patient was noted to have significant memory loss, gradually getting worse, she had abdominal surgery, also had few cardiac arrest need resuscitation during the incident, since then, she has episode of transient dizziness, mild confusion, lasting less than 1 minute, no loss of consciousness, no seizure-like activity noticed, she is having no spells almost daily basis over the past few months,  She also has strong family history of dementia, both father and mother died of dementia in their 26s  She was noted to repeating herself, got lost while driving, and making comments that is not in line with reality,    We have personally reviewed MRI of the brain without contrast May 2020, mild changes of chronic microvascular ischemia, possible remote H lacunar infarction in the right periventricular white matter, overall no change compared to previous MRI from September 2019   She was also noted to have significant atrophy, weakness of left hand, arm numbness involving left hand to left elbow level  REVIEW OF SYSTEMS: Full 14 system review of systems performed and notable only for as above All other review of systems were negative.  ALLERGIES: No Known Allergies  HOME MEDICATIONS: Current Outpatient Medications  Medication Sig Dispense Refill  . atorvastatin (LIPITOR) 40 MG tablet Take 40 mg by mouth daily.    . cinacalcet (SENSIPAR) 30 MG tablet Take 30 mg by mouth daily.    . citalopram (CELEXA) 40 MG tablet Take 40 mg by mouth daily.    Marland Kitchen  estradiol (ESTRACE) 0.1 MG/GM vaginal cream Place 1 Applicatorful vaginally every Monday, Wednesday, and Friday.   5  . labetalol (NORMODYNE) 200 MG tablet Take 400 mg by mouth 2 (two) times daily.    . valsartan (DIOVAN) 160 MG tablet Take 160 mg by mouth daily.      No current facility-administered medications for this visit.     PAST MEDICAL HISTORY: Past Medical History:  Diagnosis Date  . Acne rosacea   . Anoxic brain injury ([place])   . Arthritis   . Ataxia   . Bilateral carpal tunnel syndrome   . Breast lesion on mammography    Left  . Bulging lumbar disc   . Cardiac arrest due to trauma [facility])    From a fall off of her porch 2014  . Carpal tunnel syndrome of left wrist   . Cervical radiculopathy at C8   . 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 Depression   . 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 Dizziness due to old head injury   . Elevated liver enzymes   . Generalized osteoarthritis of multiple sites   . GERD (gastroesophageal reflux disease)   . Herniated nucleus pulposus, C4-5 left   . Hypercalcemia   . Hypertension   . Hyperthyroidism   . 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 Left hand weakness   . Lumbar radiculopathy   . Memory loss   . Menopausal syndrome   . 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 Pharyngeal dysphagia   . RLS (restless legs syndrome)   . Thyroid nodule     PAST SURGICAL HISTORY: Past Surgical History:  Procedure Laterality Date  . ABDOMINAL HYSTERECTOMY    . BREAST LUMPECTOMY WITH RADIOACTIVE SEED LOCALIZATION Left 05/13/2018   Procedure: LEFT BREAST LUMPECTOMY WITH RADIOACTIVE SEED LOCALIZATION;  Surgeon: Jovita Kussmaul, MD;  Location: [place];  Service: General;  Laterality: Left;  . BREAST SURGERY     right benign lump  . HIP ARTHROPLASTY     Right  . SPLENECTOMY, TOTAL    . TONSILLECTOMY    . TONSILLECTOMY AND ADENOIDECTOMY    . TUBAL LIGATION      FAMILY HISTORY: Family History  Problem Relation Age of Onset  . Heart failure Mother   . Dementia Mother   . Hypertension Mother   . Fibromyalgia Mother   . Migraines Mother   . Lupus Mother   . Dementia Father   . Hypertension Father   . Leukemia Father     SOCIAL HISTORY:   Social History   Socioeconomic History   . Marital status: Married    Spouse name: Not on file  . Number of children: 2  . Years of education: GED  . Highest education level: Not on file  Occupational History  . Occupation: Diabled  Social Needs  . Financial resource strain: Not on file  . Food insecurity    Worry: Not on file    Inability: Not on file  . Transportation needs    Medical: Not on file    Non-medical: Not on file  Tobacco Use  . Smoking status: Never Smoker  . Smokeless tobacco: Never Used  Substance and Sexual Activity  . Alcohol use: Not Currently  . Drug use: Never  . Sexual activity: Not on file  Lifestyle  . Physical activity    Days per week: Not on file    Minutes per session: Not on file  .  Stress: Not on file  Relationships  . Social Herbalist on phone: Not on file    Gets together: Not on file    Attends religious service: Not on file    Active member of club or organization: Not on file    Attends meetings of clubs or organizations: Not on file    Relationship status: Not on file  . Intimate partner violence    Fear of current or ex partner: Not on file    Emotionally abused: Not on file    Physically abused: Not on file    Forced sexual activity: Not on file  Other Topics Concern  . Not on file  Social History Narrative   Lives at home with her husband.   Right-handed.   Occasional tea.    PHYSICAL EXAMNIATION:  Gen: NAD, conversant, well nourised, obese, well groomed                     Cardiovascular: Regular rate rhythm, no peripheral edema, warm, nontender. Eyes: Conjunctivae clear without exudates or hemorrhage Neck: Supple, no carotid bruits. Pulmonary: Clear to auscultation bilaterally   NEUROLOGICAL EXAM:  MENTAL STATUS: Speech:    Speech is normal; fluent and spontaneous with normal comprehension.  Cognition:     Orientation to time, place and person     Normal recent and remote memory     Normal Attention span and concentration     Normal  Language, naming, repeating,spontaneous speech     Fund of knowledge   CRANIAL NERVES: CN II: Visual fields are full to confrontation.  Pupils are round equal and briskly reactive to light. CN III, IV, VI: extraocular movement are normal. No ptosis. CN V: Facial sensation is intact to pinprick in all 3 divisions bilaterally. Corneal responses are intact.  CN VII: Face is symmetric with normal eye closure and smile. CN VIII: Hearing is normal to rubbing fingers CN IX, X: Palate elevates symmetrically. Phonation is normal. CN XI: Head turning and shoulder shrug are intact CN XII: Tongue is midline with normal movements and no atrophy.  MOTOR: Left hand muscle atrophy, no significant bilateral upper extremity proximal muscle weakness, left wrist extension 4+, flexion 3,.  Finger abduction 3  REFLEXES: Reflexes are 2+ and symmetric at the biceps, triceps, knees, and ankles. Plantar responses are flexor.  SENSORY: Decreased light touch, pinprick and left hand stenting to left elbow  COORDINATION: Rapid alternating movements and fine finger movements are intact. There is no dysmetria on finger-to-nose and heel-knee-shin.    GAIT/STANCE: Posture is normal. Gait is steady with normal steps, base, arm swing, and turning. Heel and toe walking are normal. Tandem gait is normal.  Romberg is absent.  Assessment plan: History of brain injury, cardiac arrest Recurrent spells of transient loss of consciousness,  Possible partial seizure,  Proceed with EEG  Lamotrigine titrating to 100 mg twice a day  Left hand muscle atrophy, weakness  Left cervical radiculopathy versus left ulnar neuropathy  EMG nerve conduction study  MRI of cervical spine Gradual worsening memory loss  Laboratory evaluation to rule out treatable etiology   Marcial Pacas, M.D. Ph.D.  [facility] [address], [place], [place] [address] Ph: [phone] Fax: [phone]  CC: Algis Greenhouse, MD

## 2018-12-02 NOTE — Telephone Encounter (Signed)
health team order sent to GI. No auth they will reach out to the patient to schedule.  

## 2018-12-03 ENCOUNTER — Telehealth: Payer: Self-pay | Admitting: *Deleted

## 2018-12-03 LAB — CBC WITH DIFFERENTIAL/PLATELET
Basophils Absolute: 0.1 10*3/uL (ref 0.0–0.2)
Basos: 1 %
EOS (ABSOLUTE): 0.2 10*3/uL (ref 0.0–0.4)
Eos: 2 %
Hematocrit: 42.2 % (ref 34.0–46.6)
Hemoglobin: 13.9 g/dL (ref 11.1–15.9)
Immature Grans (Abs): 0 10*3/uL (ref 0.0–0.1)
Immature Granulocytes: 0 %
Lymphocytes Absolute: 2.8 10*3/uL (ref 0.7–3.1)
Lymphs: 25 %
MCH: 29.4 pg (ref 26.6–33.0)
MCHC: 32.9 g/dL (ref 31.5–35.7)
MCV: 89 fL (ref 79–97)
Monocytes Absolute: 1.2 10*3/uL — ABNORMAL HIGH (ref 0.1–0.9)
Monocytes: 11 %
Neutrophils Absolute: 6.5 10*3/uL (ref 1.4–7.0)
Neutrophils: 61 %
Platelets: 307 10*3/uL (ref 150–450)
RBC: 4.72 x10E6/uL (ref 3.77–5.28)
RDW: 12.7 % (ref 11.7–15.4)
WBC: 10.9 10*3/uL — ABNORMAL HIGH (ref 3.4–10.8)

## 2018-12-03 LAB — COMPREHENSIVE METABOLIC PANEL
ALT: 69 IU/L — ABNORMAL HIGH (ref 0–32)
AST: 41 IU/L — ABNORMAL HIGH (ref 0–40)
Albumin/Globulin Ratio: 3.1 — ABNORMAL HIGH (ref 1.2–2.2)
Albumin: 4.9 g/dL — ABNORMAL HIGH (ref 3.8–4.8)
Alkaline Phosphatase: 85 IU/L (ref 39–117)
BUN/Creatinine Ratio: 33 — ABNORMAL HIGH (ref 12–28)
BUN: 21 mg/dL (ref 8–27)
Bilirubin Total: 0.9 mg/dL (ref 0.0–1.2)
CO2: 20 mmol/L (ref 20–29)
Calcium: 9.8 mg/dL (ref 8.7–10.3)
Chloride: 105 mmol/L (ref 96–106)
Creatinine, Ser: 0.63 mg/dL (ref 0.57–1.00)
GFR calc Af Amer: 110 mL/min/{1.73_m2} (ref >59)
GFR calc non Af Amer: 95 mL/min/{1.73_m2} (ref >59)
Globulin, Total: 1.6 g/dL (ref 1.5–4.5)
Glucose: 123 mg/dL — ABNORMAL HIGH (ref 65–99)
Potassium: 5.1 mmol/L (ref 3.5–5.2)
Sodium: 142 mmol/L (ref 134–144)
Total Protein: 6.5 g/dL (ref 6.0–8.5)

## 2018-12-03 LAB — SEDIMENTATION RATE: Sed Rate: 2 mm/hr (ref 0–40)

## 2018-12-03 LAB — C-REACTIVE PROTEIN: CRP: <1 mg/L (ref 0–10)

## 2018-12-03 LAB — CK: Total CK: 141 U/L (ref 32–182)

## 2018-12-03 LAB — VITAMIN B12: Vitamin B-12: 558 pg/mL (ref 232–1245)

## 2018-12-03 LAB — RPR: RPR Ser Ql: NONREACTIVE

## 2018-12-03 LAB — TSH: TSH: 2.97 u[IU]/mL (ref 0.450–4.500)

## 2018-12-03 LAB — FOLATE: Folate: 12.5 ng/mL (ref >3.0)

## 2018-12-03 LAB — HIV ANTIBODY (ROUTINE TESTING W REFLEX): HIV Screen 4th Generation wRfx: NONREACTIVE

## 2018-12-03 NOTE — Telephone Encounter (Signed)
I have spoken with the patient to provide her lab results.  She verbalized understanding.

## 2018-12-03 NOTE — Telephone Encounter (Signed)
-----   Message from Marcial Pacas, MD sent at 12/03/2018  9:01 AM EDT ----- Please call patient for there was no significant abnormality on extensive laboratory evaluations, with exception of mildly elevated glucose 123, this might be related to the timing of the blood sample on the last meal.

## 2018-12-04 ENCOUNTER — Other Ambulatory Visit: Payer: Self-pay

## 2018-12-04 ENCOUNTER — Ambulatory Visit (INDEPENDENT_AMBULATORY_CARE_PROVIDER_SITE_OTHER): Payer: PPO | Admitting: Neurology

## 2018-12-04 DIAGNOSIS — M6281 Muscle weakness (generalized): Secondary | ICD-10-CM

## 2018-12-04 DIAGNOSIS — R410 Disorientation, unspecified: Secondary | ICD-10-CM | POA: Diagnosis not present

## 2018-12-04 DIAGNOSIS — R29898 Other symptoms and signs involving the musculoskeletal system: Secondary | ICD-10-CM

## 2018-12-04 NOTE — Procedures (Signed)
   HISTORY: 63 year old female, with history of dizziness, mild spells  TECHNIQUE:  This is a routine 16 channel EEG recording with one channel devoted to a limited EKG recording.  It was performed during wakefulness, drowsiness and asleep.  Hyperventilation and photic stimulation were performed as activating procedures.  There are minimum muscle and movement artifact noted.  Upon maximum arousal, posterior dominant waking rhythm consistent of with mixed alpha and small amplitude beta range activity, activities are symmetric over the bilateral posterior derivations and attenuated with eye opening.  Hyperventilation produced mild/moderate buildup with higher amplitude and the slower activities noted.  Photic stimulation did not alter the tracing.  During EEG recording, patient developed drowsiness and no deeper stage of sleep was achieved  During EEG recording, there was no epileptiform discharge noted.  EKG demonstrate sinus rhythm, with heart rate of hertz  CONCLUSION: This is a  normal awake EEG.  There is no electrodiagnostic evidence of epileptiform discharge.  Marcial Pacas, M.D. Ph.D.  [facility] [place], [place] [address] Phone: [phone] Fax:      [phone]

## 2018-12-09 ENCOUNTER — Ambulatory Visit
Admission: RE | Admit: 2018-12-09 | Discharge: 2018-12-09 | Disposition: A | Discharge status: Home / Self Care | Payer: PPO | Source: Ambulatory Visit | Attending: Neurology | Admitting: Neurology

## 2018-12-09 DIAGNOSIS — M6281 Muscle weakness (generalized): Secondary | ICD-10-CM

## 2018-12-10 ENCOUNTER — Telehealth: Payer: Self-pay | Admitting: Neurology

## 2018-12-10 NOTE — Telephone Encounter (Signed)
I have spoken with the patient and she has been notified of the MRI results below.  She will keep her pending appt for NCV/EMG on 01/20/2019.

## 2018-12-10 NOTE — Telephone Encounter (Signed)
Left message requesting a return call.

## 2018-12-10 NOTE — Telephone Encounter (Signed)
Please call patient, MRI of cervical spine showed multilevel degenerative changes, there was no evidence of spinal cord compression, moderate left foraminal narrowing at C3-4,  IMPRESSION: This MRI of the cervical spine without contrast shows the following: 1.   The spinal cord appears normal. 2.   The central canal is narrowed at C3-C4, C5-C6 and C6-C7 but not enough to be considered spinal stenosis. 3.   Multilevel degenerative changes as detailed above.  There is no definite nerve root compression, though there is moderate left foraminal narrowing at C3-C4 encroaching upon the left C4 nerve root 4.   No significant changes compared to the 11/11/2015 MRI.

## 2018-12-17 DIAGNOSIS — E559 Vitamin D deficiency, unspecified: Secondary | ICD-10-CM | POA: Diagnosis not present

## 2018-12-17 DIAGNOSIS — I10 Essential (primary) hypertension: Secondary | ICD-10-CM | POA: Diagnosis not present

## 2018-12-18 NOTE — Telephone Encounter (Signed)
The patient had an EEG on 12/04/2018.  She is aware of the normal results.  She understands to continue titrating up her lamotrigine dosage. She is currently taking 75mg  BID and doing well on the medication (no rash).  She will also keep her pending NCV/EMG appt on 01/20/2019.

## 2018-12-18 NOTE — Telephone Encounter (Signed)
Patient called and stated that she had a "Brave wave test" in May and has not heard back from anyone. Please call and advise.

## 2019-01-01 DIAGNOSIS — N390 Urinary tract infection, site not specified: Secondary | ICD-10-CM | POA: Diagnosis not present

## 2019-01-01 DIAGNOSIS — N183 Chronic kidney disease, stage 3 (moderate): Secondary | ICD-10-CM | POA: Diagnosis not present

## 2019-01-01 DIAGNOSIS — I10 Essential (primary) hypertension: Secondary | ICD-10-CM | POA: Diagnosis not present

## 2019-01-18 IMAGING — MG MM CLIP PLACEMENT
4 series · 4 of 12 positions shown · non-contrast
Comparison: Previous exam(s).

CLINICAL DATA: Status post stereotactic core needle biopsy an area
of architectural distortion in the left breast.

EXAM:
DIAGNOSTIC LEFT MAMMOGRAM POST STEREOTACTIC BIOPSY

[L ML synth-2D]
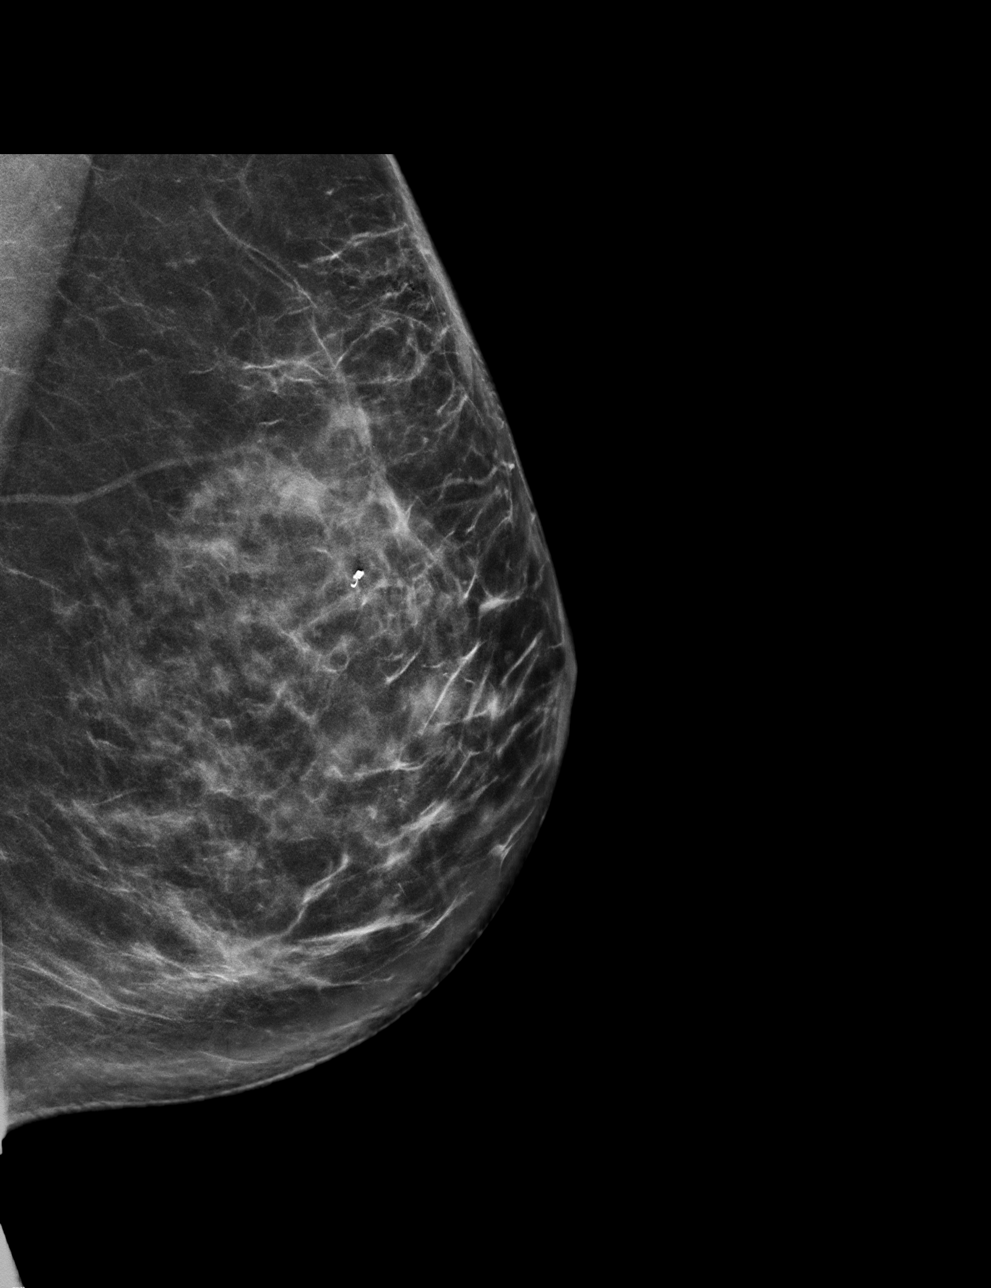

[L CC synth-2D]
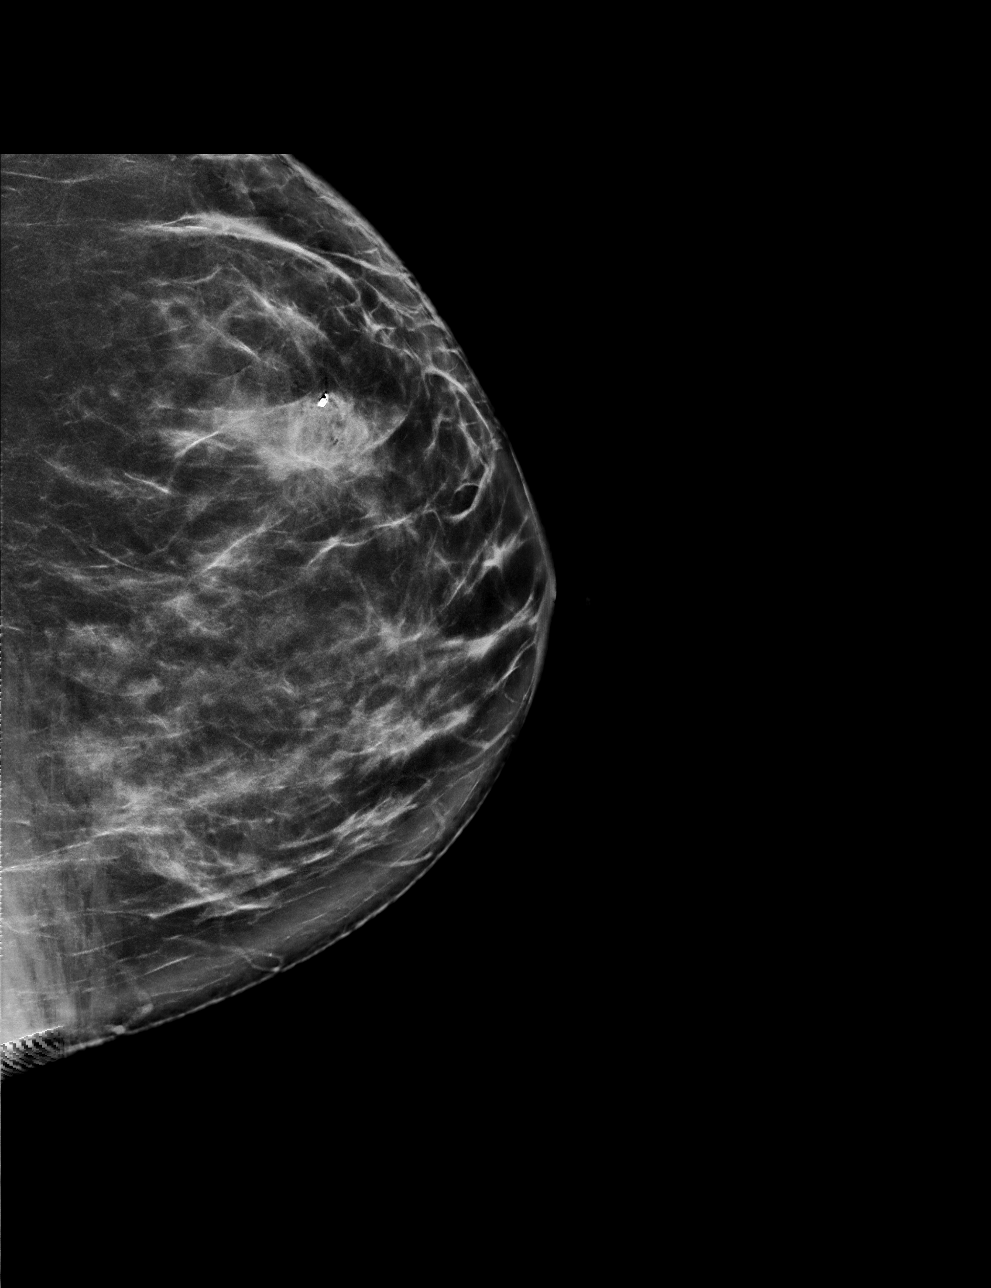

[L CC tomo · tomo slice 39/78.0]
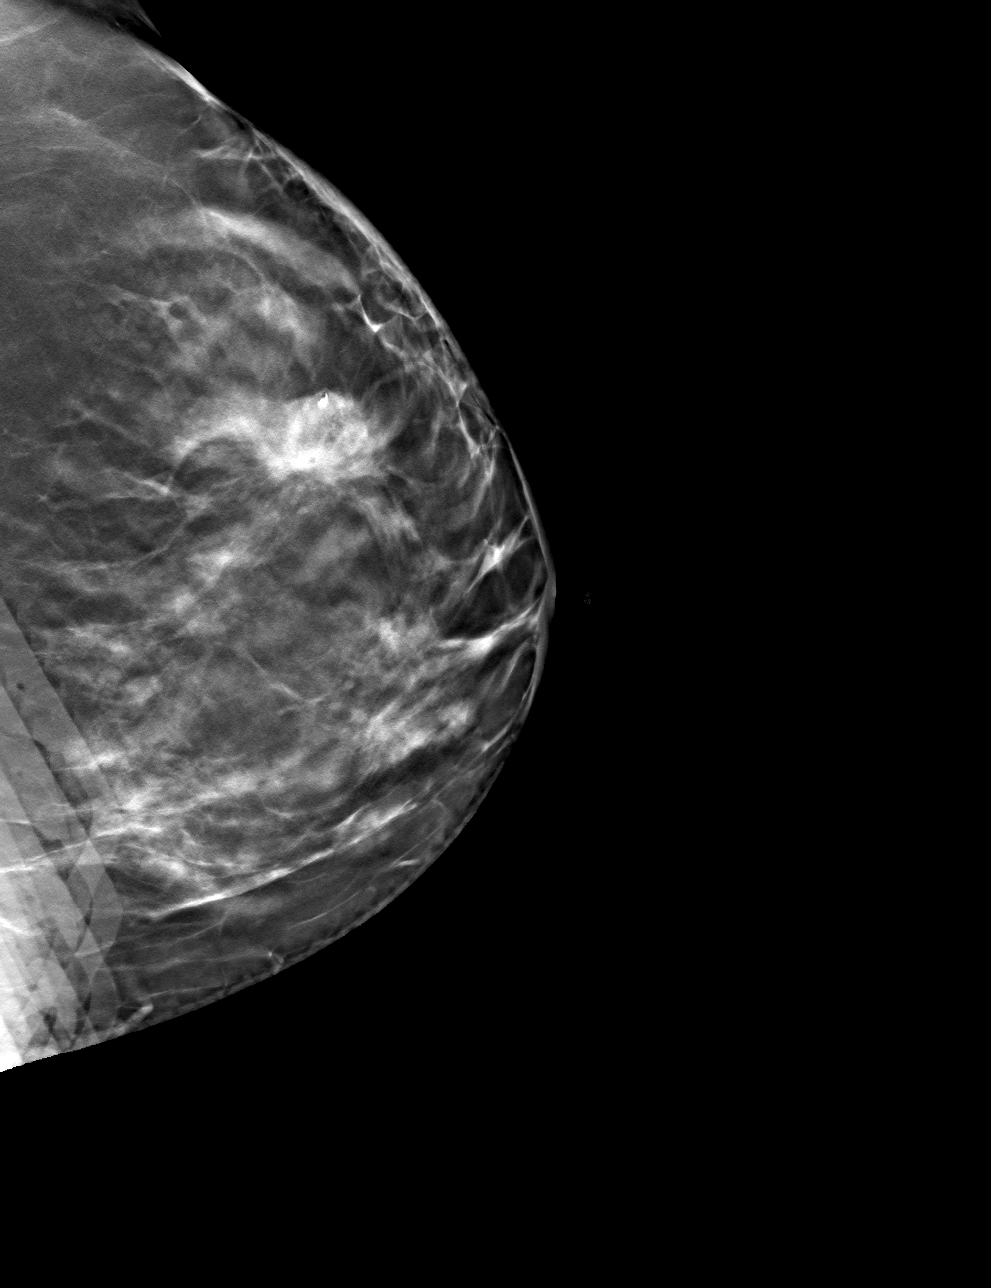

[L ML tomo · tomo slice 36/71.0]
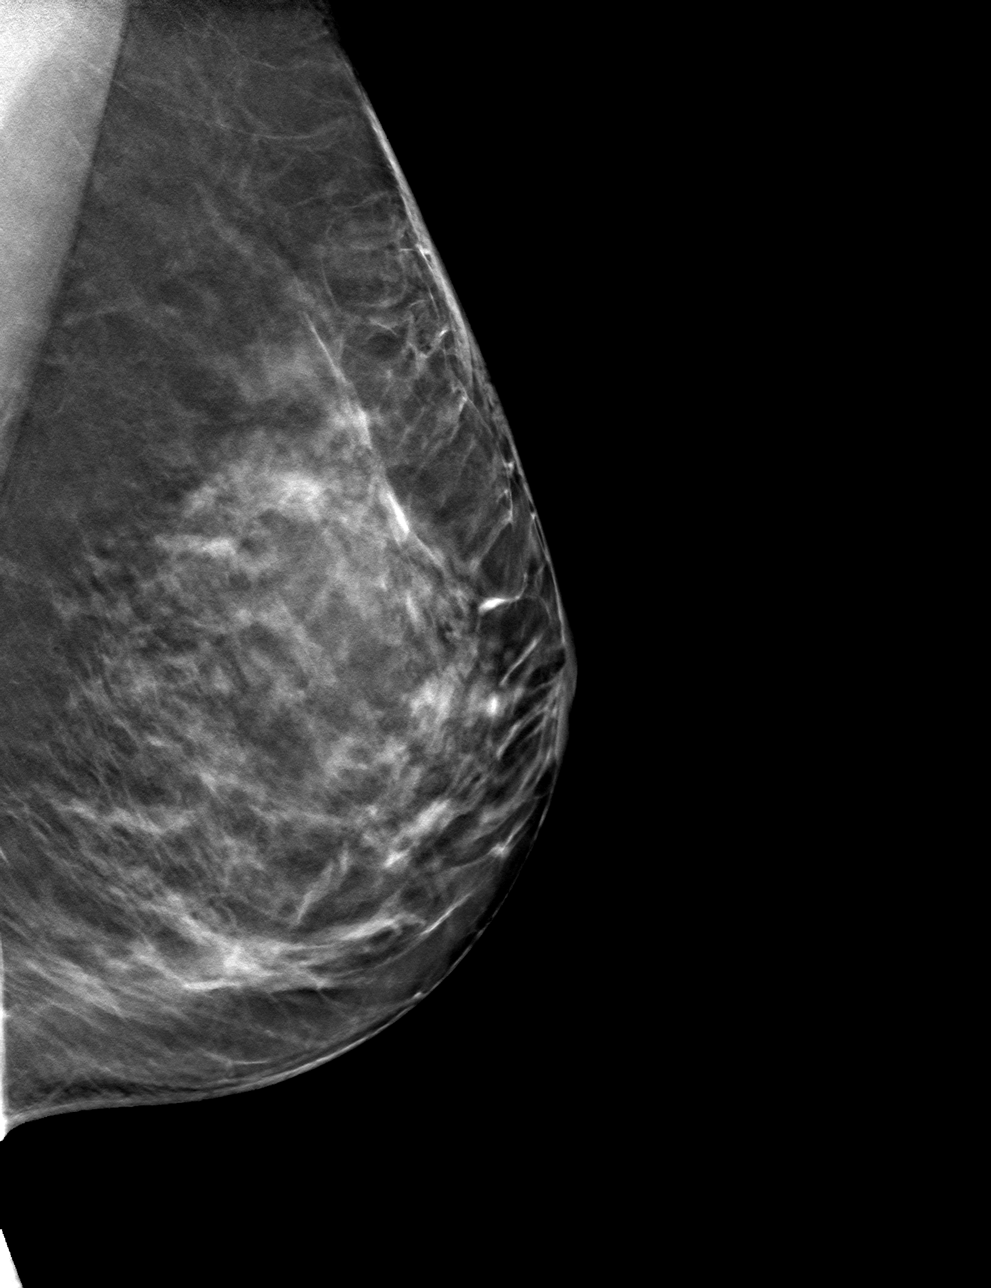

[4 of 12 positions shown; findings below may reference images not displayed]

FINDINGS: Mammographic images were obtained following stereotactic guided
biopsy of a left breast architectural distortion. The coil shaped
biopsy clip lies in the upper outer quadrant, approximately 6 mm
lateral to the center of the architectural distortion.
IMPRESSION: Coil shaped biopsy clip lies slightly lateral, 6 mm, to the center
of the upper outer quadrant left breast architectural distortion.

Final Assessment: Post Procedure Mammograms for Marker Placement

## 2019-01-18 IMAGING — MG STEREOTACTIC CORE NEEDLE BIOPSY
8 of 12 series · 8 of 24 positions shown · non-contrast
Comparison: Previous exams.

ADDENDUM:
Pathology revealed COMPLEX SCLEROSING LESION of the Left breast,
upper outer quadrant. This was found to be concordant by Dr. Dimitris Chatzinikolas
[place], with excision recommended.

Pathology results were discussed with the patient by telephone. The
patient reported doing well after the biopsy with tenderness and
bruising at the site. Post biopsy instructions and care were
reviewed and questions were answered. The patient was encouraged to
call The [REDACTED] for any additional
concerns.
Surgical consultation has been arranged with Dr. Nola Oxendine at
[REDACTED] on March 20, 2018.
Pathology results reported by Rudorkristina Maribela, RN on 03/18/2018.
The biopsy-proven complex sclerosing lesion in the UPPER-OUTER
QUADRANT of the LEFT breast is estimated to measure 3.4 x 2.3 x
centimeters.
CLINICAL DATA: Patient presents for stereotactic core needle biopsy
of an area of architectural distortion in the upper outer left
breast.
EXAM:
LEFT BREAST STEREOTACTIC CORE NEEDLE BIOPSY

[L (1 of 8)]
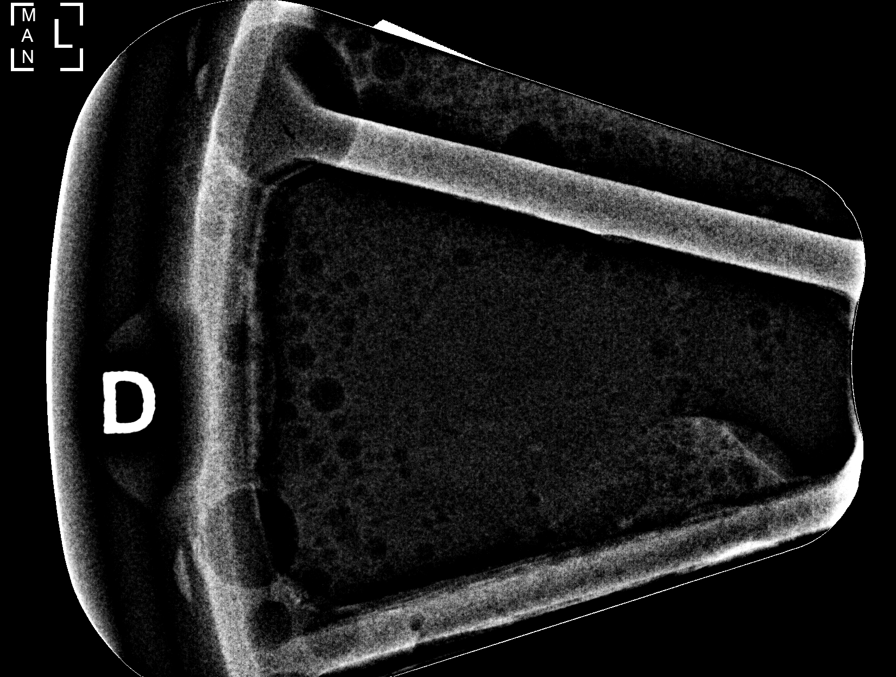

[L (2 of 8)]
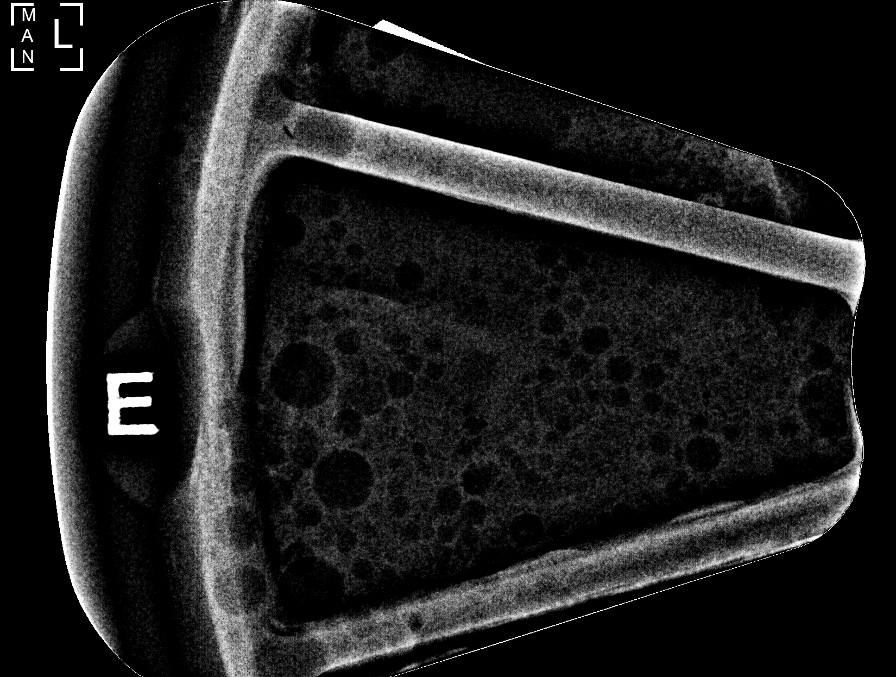

[L (3 of 8)]
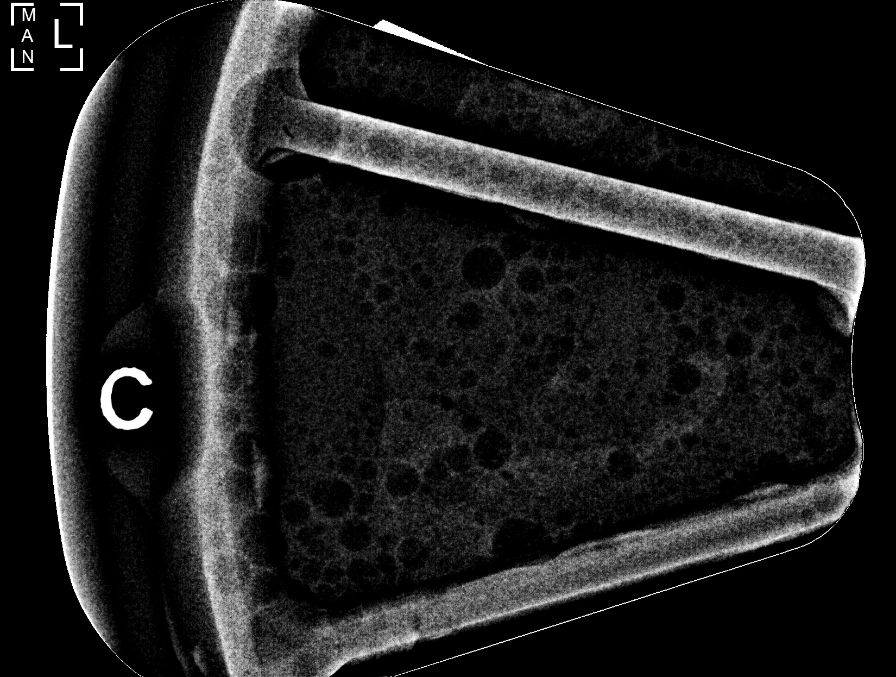

[L (4 of 8)]
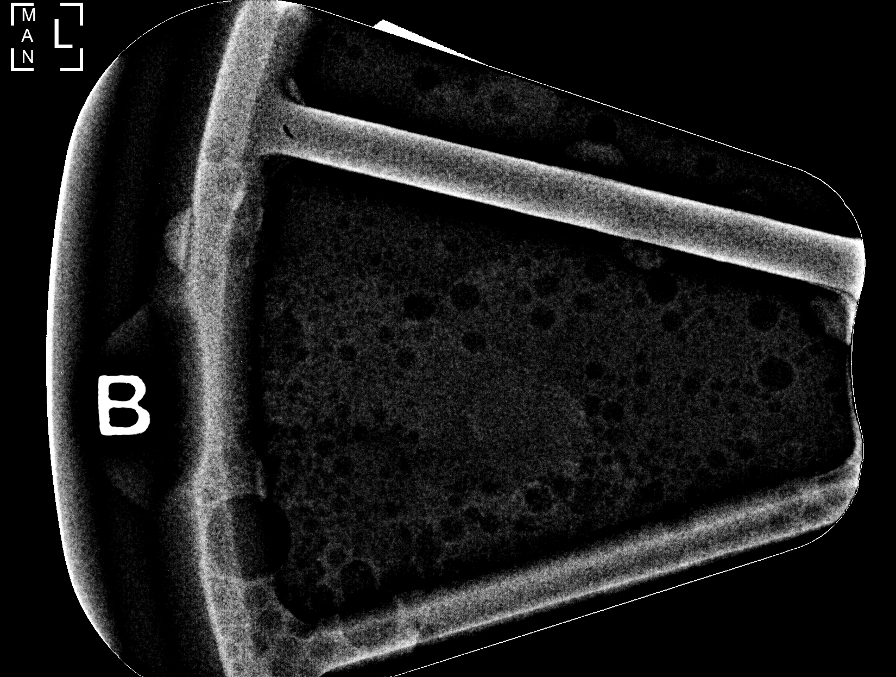

[L (5 of 8)]
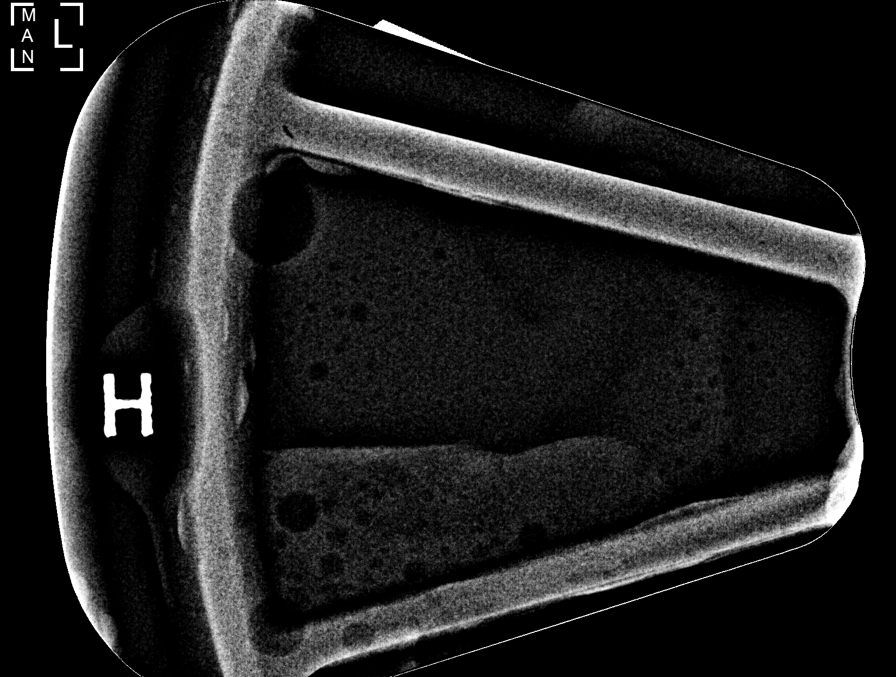

[L (6 of 8)]
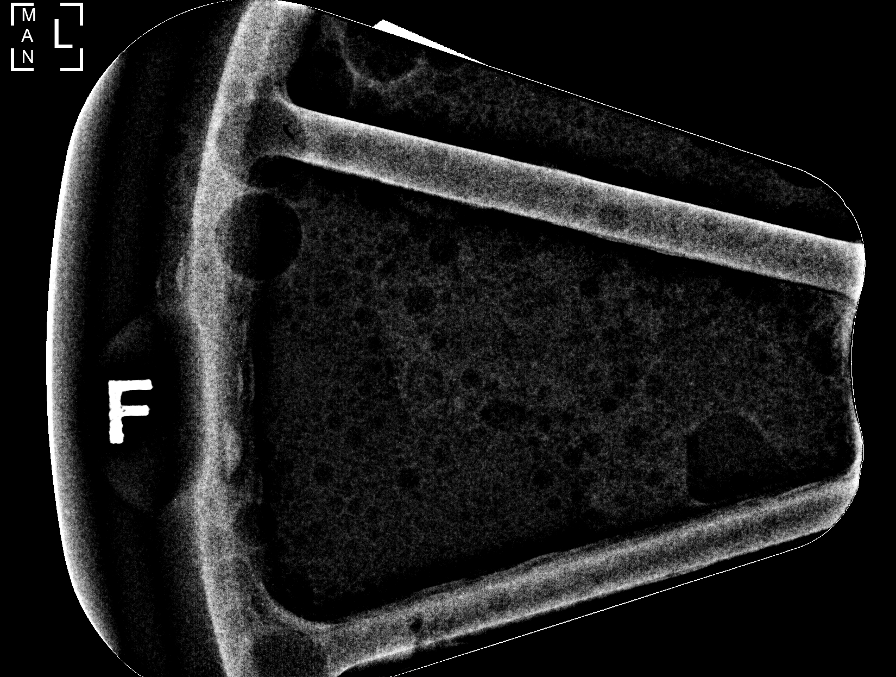

[L (7 of 8)]
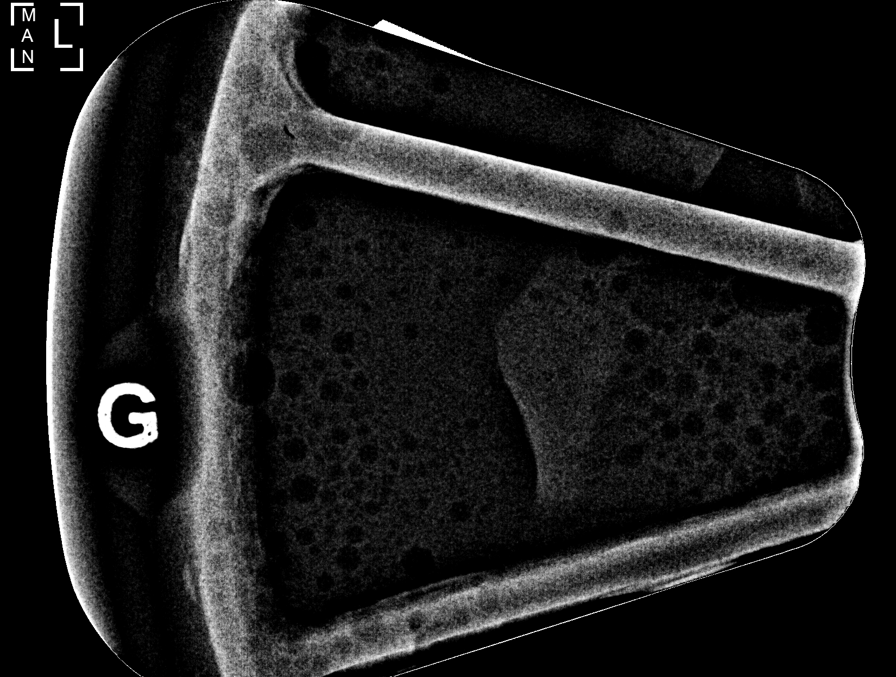

[L (8 of 8)]
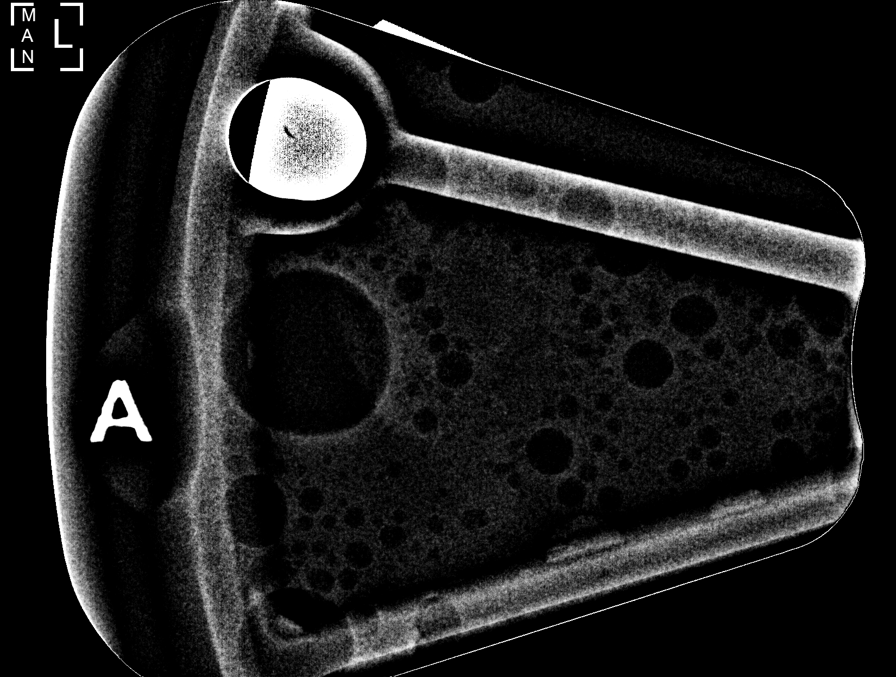

[8 of 24 positions shown; findings below may reference images not displayed]



Using sterile technique and 1% Lidocaine as local anesthetic, under
stereotactic guidance, a 9 gauge vacuum assisted device was used to
perform core needle biopsy of the area of architectural distortion
in the outer left breast was performed using a superior approach.

Lesion quadrant: Upper outer quadrant

At the conclusion of the procedure, a coil shaped tissue marker clip
was deployed into the biopsy cavity. Follow-up 2-view mammogram was
performed and dictated separately.
IMPRESSION: Stereotactic-guided biopsy of an area of architectural distortion in
the left breast. No apparent complications.

## 2019-01-20 ENCOUNTER — Other Ambulatory Visit: Payer: Self-pay

## 2019-01-20 ENCOUNTER — Ambulatory Visit (INDEPENDENT_AMBULATORY_CARE_PROVIDER_SITE_OTHER): Payer: PPO | Admitting: Neurology

## 2019-01-20 ENCOUNTER — Encounter (INDEPENDENT_AMBULATORY_CARE_PROVIDER_SITE_OTHER): Payer: PPO | Admitting: Neurology

## 2019-01-20 DIAGNOSIS — R413 Other amnesia: Secondary | ICD-10-CM

## 2019-01-20 DIAGNOSIS — R0689 Other abnormalities of breathing: Secondary | ICD-10-CM

## 2019-01-20 DIAGNOSIS — M6281 Muscle weakness (generalized): Secondary | ICD-10-CM | POA: Diagnosis not present

## 2019-01-20 DIAGNOSIS — R29898 Other symptoms and signs involving the musculoskeletal system: Secondary | ICD-10-CM | POA: Diagnosis not present

## 2019-01-20 DIAGNOSIS — Z0289 Encounter for other administrative examinations: Secondary | ICD-10-CM

## 2019-01-20 MED ORDER — MEMANTINE HCL 10 MG PO TABS: 10.0000 mg | ORAL_TABLET | Freq: Two times a day (BID) | ORAL | 11 refills | Status: DC

## 2019-01-20 NOTE — Procedures (Signed)
Full Name: Tammy Mcknight Gender: Female MRN #: [id number] Date of Birth: 1956/05/17    Visit Date: 01/20/2019 13:32 Age: 63 Years 77 Months Old Examining Physician: Marcial Pacas, MD  Referring Physician: Marcial Pacas, MD History: 63 years old female, with history of traumatic brain injury, spleen injury, cardiac arrest in 2014, presented with gradual onset left hand painless muscle atrophy, weakness few years after the incident  On examination: Significant left hand intrinsic muscle atrophy, significant left grip, finger abduction, flexion, extension weakness, mild left wrist flexion extension weakness, she also has mild right shoulder abduction, mild right more than left ankle dorsiflexion weakness.  Hyperreflexia, bilateral Babinski sign, there was no significant sensory loss  Summary of the tests:  Nerve conduction study:  Right sural, superficial peroneal sensory responses were normal.  Bilateral ulnar sensory response showed normal peak latency, with mildly decreased snap amplitude.  Bilateral median sensory responses showed mildly prolonged peak latency, with normal snap amplitude.  Bilateral radial sensory response showed normal peak latency with slightly decreased snap amplitude.  Bilateral medial antebrachial cutaneous sensory responses were normal.  Bilateral antebrachial sensory responses showed significantly decreased snap amplitude  Right peroneal to EDB, tibial motor responses showed normal distal latency, C map amplitude, with mildly slow conduction velocity.  Left ulnar and median motor responses showed significantly decreased C map amplitude.  Right ulnar motor responses were normal.  Right median motor responses showed mildly prolonged distal latency, with normal C map amplitude, with mildly slow conduction velocity  Electromyography: Selected needle examination was performed at bilateral upper extremity muscles; left lower extremity muscles; left cervical and lumbosacral  paraspinal muscles; left thoracic paraspinal muscles  There is evidence of active denervation involving left first dorsal interossei, abductor pollicis brevis; chronic neuropathic changes involving left flexor carpi ulnar wrist, extensor digitorum communis, there was also chronic neuropathic changes involving right first dorsal interossei, extensor digitorum communis.  There is also evidence of mild chronic neuropathic changes involving left tibialis anterior, tibialis posterior, vastus lateralis.  There was no spontaneous activity at left cervical, lumbosacral, left lower thoracic paraspinal muscles.   Conclusion: This is an abnormal study.  There is electrodiagnostic evidence of mild chronic neuropathic changes involving bilateral cervical, milder degree left lumbar sacral myotomes.  There is no evidence of chronic neuropathic changes involving left cervical, thoracic, lumbar sacral paraspinal muscles.  There is also evidence of moderate right carpal tunnel syndromes.  Differentiation diagnosis of above findings include motor neuron disease, metabolic toxic, nutritional deficiency, paraneoplastic.    ------------------------------- Marcial Pacas, M.D. PhD  [facility] [place], [place] [address] Tel: [phone] Fax: [phone]        [facility]    Nerve / Sites Muscle Latency Ref. Amplitude Ref. Rel Amp Segments Distance Velocity Ref. Area    ms ms mV mV %  cm m/s m/s mVms  R Median - APB     Wrist APB 4.6 ?4.4 5.0 ?4.0 100 Wrist - APB 7   14.7     Upper arm APB 9.3  5.0  100 Upper arm - Wrist 22 47 ?49 14.7  L Median - APB     Wrist APB 10.5 ?4.4 0.2 ?4.0 100 Wrist - APB 7   0.5     Upper arm APB NR  NR  NR Upper arm - Wrist 23 NR ?49 NR  R Ulnar - ADM     Wrist ADM 2.7 ?3.3 8.5 ?6.0 100 Wrist - ADM  7   22.6     B.Elbow ADM 6.4  7.0  82.7 B.Elbow - Wrist 20 55 ?49 18.8     A.Elbow ADM 8.2  6.2  89.1 A.Elbow - B.Elbow 10 55 ?49 18.2         A.Elbow - Wrist       L Ulnar - ADM     Wrist ADM 4.8 ?3.3 0.2 ?6.0 100 Wrist - ADM 7   0.8     B.Elbow ADM 7.9  0.2  86.1 B.Elbow - Wrist 20 65 ?49 0.9     A.Elbow ADM 9.9  0.2  119 A.Elbow - B.Elbow 10 49 ?49 0.8         A.Elbow - Wrist      R Peroneal - EDB     Ankle EDB 5.8 ?6.5 2.1 ?2.0 100 Ankle - EDB 9   6.8     Fib head EDB 13.7  1.7  81.5 Fib head - Ankle 33 42 ?44 6.1     Pop fossa EDB 16.5  1.6  91.9 Pop fossa - Fib head 10 36 ?44 5.5         Pop fossa - Ankle      R Tibial - AH     Ankle AH 3.4 ?5.8 7.2 ?4.0 100 Ankle - AH 9   11.7     Pop fossa AH 14.1  5.2  73 Pop fossa - Ankle 41 38 ?41 9.0                      SNC    Nerve / Sites Rec. Site Peak Lat Ref.  Amp Ref. Segments Distance    ms ms V V  cm  R Radial - Anatomical snuff box (Forearm)     Forearm Wrist 2.7 ?2.9 11 ?15 Forearm - Wrist 10  L Radial - Anatomical snuff box (Forearm)     Forearm Wrist 2.6 ?2.9 12 ?15 Forearm - Wrist 10  R Sural - Ankle (Calf)     Calf Ankle 3.3 ?4.4 9 ?6 Calf - Ankle 14  R Superficial peroneal - Ankle     Lat leg Ankle 4.3 ?4.4 6 ?6 Lat leg - Ankle 14  R Median - Orthodromic (Dig II, Mid palm)     Dig II Wrist 4.1 ?3.4 10 ?10 Dig II - Wrist 13  L Median - Orthodromic (Dig II, Mid palm)     Dig II Wrist 3.8 ?3.4 13 ?10 Dig II - Wrist 13  R Ulnar - Orthodromic, (Dig V, Mid palm)     Dig V Wrist 3.0 ?3.1 3 ?5 Dig V - Wrist 11  L Ulnar - Orthodromic, (Dig V, Mid palm)     Dig V Wrist 2.9 ?3.1 4 ?5 Dig V - Wrist 11  L Lateral antebrachial cutaneous - Forearm (Elbow)     Elbow Forearm 4.0 ?3.0 4 ?10 Elbow - Forearm 12  R Lateral antebrachial cutaneous - Forearm (Elbow)     Elbow Forearm 2.4 ?3.0 4 ?10 Elbow - Forearm 12  L Medial antebrachial cutaneous - Forearm (Elbow)     Elbow Forearm 2.4 ?3.2 6 ?5 Elbow - Forearm 12  R Medial antebrachial cutaneous - Forearm (Elbow)     Elbow Forearm 2.3 ?3.2 7 ?5 Elbow - Forearm 12  F  Wave    Nerve F Lat Ref.   ms ms   R Tibial - AH 55.4 ?56.0  R Ulnar - ADM 30.6 ?32.0  L Ulnar - ADM NR ?32.0           EMG       EMG Summary Table    Spontaneous MUAP Recruitment  Muscle IA Fib PSW Fasc Other Amp Dur. Poly Pattern  L. Pronator teres Increased None None None _______ Normal Normal Normal Reduced  L. Triceps brachii Normal None None None _______ Normal Normal Normal Normal  L. Deltoid Normal None None None _______ Normal Normal Normal Normal  L. Biceps brachii Normal None None None _______ Normal Normal Normal Normal  L. Brachioradialis Normal None None None _______ Normal Normal Normal Normal  L. Extensor digitorum communis Increased None None None _______ Increased Increased 1+ Reduced  R. First dorsal interosseous Increased None None None _______ Increased Increased Normal Reduced  R. Extensor digitorum communis Normal None None None _______ Normal Normal Normal Normal  R. Triceps brachii Normal None None None _______ Normal Normal Normal Normal  R. Thoracic paraspinals Normal None None None _______ Normal Normal Normal Normal  R. Deltoid Normal None None None _______ Normal Normal Normal Normal  R. Biceps brachii Normal None None None _______ Normal Normal Normal Normal  R. Lumbar paraspinals (up) Normal None None None _______ Normal Normal Normal Normal  R. Thoracic paraspinals (mid) Normal None None None _______ Normal Normal Normal Normal  R. Thoracic paraspinals (low) Normal None None None _______ Normal Normal Normal Normal  L. First dorsal interosseous Increased 1+ None None _______ Increased Increased 1+ Reduced  L. Abductor pollicis brevis Increased 1+ None None _______ Increased Increased 1+ Reduced  R. Cervical paraspinals Normal None None None _______ Normal Normal Normal Normal  L.  Tibialis anterior Normal None None None _______ Increased Increased Increased Reduced  L.  Tibialis posterior Normal None None None _______ Increased Increased Increased Reduced  L.  Medial gastrocnemius Normal  None None None _______ Increased Increased Increased Reduced  L.  Vastus lateralis Normal None None None _______ Increased Increased Increased Normal

## 2019-01-21 ENCOUNTER — Telehealth: Payer: Self-pay | Admitting: Neurology

## 2019-01-21 NOTE — Telephone Encounter (Signed)
I called patient and LVM to schedule a 2 month follow-up with Dr. Krista Blue for October.

## 2019-01-24 ENCOUNTER — Telehealth: Payer: Self-pay | Admitting: Neurology

## 2019-01-24 ENCOUNTER — Other Ambulatory Visit (HOSPITAL_COMMUNITY): Payer: Self-pay

## 2019-01-24 NOTE — Telephone Encounter (Signed)
She called that she was feeling weaker since going up to two pills of the mamantine daily  I advised her to go back to one pill a day for 2-3 weeks and then try to go back up to 2 pills.   If she feels weak again when she does this, she should give a call

## 2019-01-27 ENCOUNTER — Telehealth: Payer: Self-pay | Admitting: Neurology

## 2019-01-27 DIAGNOSIS — R531 Weakness: Secondary | ICD-10-CM

## 2019-01-27 NOTE — Telephone Encounter (Signed)
I put the order for Covid 19 test, please make sure that I did it right

## 2019-01-27 NOTE — Telephone Encounter (Signed)
Dr. Krista Blue Patient is scheduled for her Pulmonary function test 02/07/2019. Patient is aware of this.  Dr. Krista Blue because you are the ordering doctor you need to order a covid  Test for patient and results need to be negative before patient comes in 02/07/2019.

## 2019-01-27 NOTE — Addendum Note (Signed)
Addended by: Marcial Pacas on: 01/27/2019 11:54 AM   Modules accepted: Orders

## 2019-01-28 NOTE — Telephone Encounter (Signed)
I have talked to Patient she has her testing done 02/04/2019. Patient has to drive up to [employer] and go to the building not the trent. Patient has to relay she has an upcoming apt on the 02/07/2019 . Testing hour's on 02/04/2019 are 8:00 am - 3:15 pm.   [facility] testing site is aware of all details also .

## 2019-01-28 NOTE — Telephone Encounter (Signed)
I have called and left message to see where patient needs to go for her covid testing

## 2019-02-04 ENCOUNTER — Other Ambulatory Visit (HOSPITAL_COMMUNITY)
Admission: RE | Admit: 2019-02-04 | Discharge: 2019-02-04 | Disposition: A | Discharge status: Home / Self Care | Payer: PPO | Source: Ambulatory Visit | Attending: Neurology | Admitting: Neurology

## 2019-02-04 DIAGNOSIS — Z20828 Contact with and (suspected) exposure to other viral communicable diseases: Secondary | ICD-10-CM | POA: Diagnosis not present

## 2019-02-04 DIAGNOSIS — Z01812 Encounter for preprocedural laboratory examination: Secondary | ICD-10-CM | POA: Diagnosis not present

## 2019-02-04 DIAGNOSIS — R358 Other polyuria: Secondary | ICD-10-CM | POA: Diagnosis not present

## 2019-02-04 DIAGNOSIS — N3592 Unspecified urethral stricture, female: Secondary | ICD-10-CM | POA: Diagnosis not present

## 2019-02-04 LAB — SARS CORONAVIRUS 2 (TAT 6-24 HRS): SARS Coronavirus 2: NEGATIVE

## 2019-02-05 DIAGNOSIS — I10 Essential (primary) hypertension: Secondary | ICD-10-CM | POA: Diagnosis not present

## 2019-02-05 DIAGNOSIS — G40909 Epilepsy, unspecified, not intractable, without status epilepticus: Secondary | ICD-10-CM | POA: Insufficient documentation

## 2019-02-05 DIAGNOSIS — S93401A Sprain of unspecified ligament of right ankle, initial encounter: Secondary | ICD-10-CM | POA: Diagnosis not present

## 2019-02-05 DIAGNOSIS — K625 Hemorrhage of anus and rectum: Secondary | ICD-10-CM | POA: Insufficient documentation

## 2019-02-05 DIAGNOSIS — E119 Type 2 diabetes mellitus without complications: Secondary | ICD-10-CM | POA: Diagnosis not present

## 2019-02-05 DIAGNOSIS — Z0000 Encounter for general adult medical examination without abnormal findings: Secondary | ICD-10-CM | POA: Diagnosis not present

## 2019-02-05 DIAGNOSIS — F3340 Major depressive disorder, recurrent, in remission, unspecified: Secondary | ICD-10-CM | POA: Diagnosis not present

## 2019-02-05 DIAGNOSIS — E782 Mixed hyperlipidemia: Secondary | ICD-10-CM | POA: Diagnosis not present

## 2019-02-06 ENCOUNTER — Telehealth: Payer: Self-pay | Admitting: Neurology

## 2019-02-06 NOTE — Telephone Encounter (Signed)
Pt's mother in law called wanting to discuss the pt's memory medicine and to clarify the instructions on how to take it. Pt seems to be taking it wrong. Please advise.

## 2019-02-06 NOTE — Telephone Encounter (Signed)
I am unable to return the call to [name] since she is not on the patient's DPR.  I was able to speak directly with the patient. She has mild memory loss and manages her own medications.  She has been taking memantine 10mg , two tablets in the morning.  She did not realize the two doses needed to be split.  She verbalized understanding to start taking one tablet in the morning and one tablet in the evening.  She will also let [name] know the reason her call was not returned.  I placed a note to obtain an updated DPR at her next appointment.

## 2019-02-07 ENCOUNTER — Other Ambulatory Visit: Payer: Self-pay

## 2019-02-07 ENCOUNTER — Ambulatory Visit (HOSPITAL_COMMUNITY)
Admission: RE | Admit: 2019-02-07 | Discharge: 2019-02-07 | Disposition: A | Discharge status: Home / Self Care | Payer: PPO | Source: Ambulatory Visit | Attending: Neurology | Admitting: Neurology

## 2019-02-07 DIAGNOSIS — R0689 Other abnormalities of breathing: Secondary | ICD-10-CM | POA: Diagnosis not present

## 2019-02-07 DIAGNOSIS — R29898 Other symptoms and signs involving the musculoskeletal system: Secondary | ICD-10-CM | POA: Diagnosis not present

## 2019-02-07 DIAGNOSIS — R413 Other amnesia: Secondary | ICD-10-CM | POA: Diagnosis not present

## 2019-02-07 DIAGNOSIS — M6281 Muscle weakness (generalized): Secondary | ICD-10-CM | POA: Diagnosis not present

## 2019-02-07 LAB — PULMONARY FUNCTION TEST
DL/VA % pred: 87 %
DL/VA: 3.6 ml/min/mmHg/L
DLCO unc % pred: 79 %
DLCO unc: 17.54 ml/min/mmHg
FEF 25-75 Post: 2.11 L/sec
FEF 25-75 Pre: 2.93 L/sec
FEF2575-%Change-Post: -28 %
FEF2575-%Pred-Post: 89 %
FEF2575-%Pred-Pre: 124 %
FEV1-%Change-Post: -3 %
FEV1-%Pred-Post: 96 %
FEV1-%Pred-Pre: 100 %
FEV1-Post: 2.65 L
FEV1-Pre: 2.75 L
FEV1FVC-%Change-Post: 0 %
FEV1FVC-%Pred-Pre: 104 %
FEV6-%Change-Post: -2 %
FEV6-%Pred-Post: 96 %
FEV6-%Pred-Pre: 99 %
FEV6-Post: 3.32 L
FEV6-Pre: 3.41 L
FEV6FVC-%Change-Post: 0 %
FEV6FVC-%Pred-Post: 103 %
FEV6FVC-%Pred-Pre: 103 %
FVC-%Change-Post: -2 %
FVC-%Pred-Post: 93 %
FVC-%Pred-Pre: 95 %
FVC-Post: 3.32 L
FVC-Pre: 3.42 L
Post FEV1/FVC ratio: 80 %
Post FEV6/FVC ratio: 100 %
Pre FEV1/FVC ratio: 81 %
Pre FEV6/FVC Ratio: 100 %
RV % pred: 110 %
RV: 2.45 L
TLC % pred: 104 %
TLC: 5.78 L

## 2019-02-07 MED ORDER — ALBUTEROL SULFATE (2.5 MG/3ML) 0.083% IN NEBU
2.5000 mg | INHALATION_SOLUTION | Freq: Once | RESPIRATORY_TRACT | Status: AC
  Administered 2019-02-07: 13:00:00 2.5 mg via RESPIRATORY_TRACT

## 2019-03-25 ENCOUNTER — Other Ambulatory Visit: Payer: Self-pay

## 2019-03-25 ENCOUNTER — Encounter: Payer: Self-pay | Admitting: Neurology

## 2019-03-25 ENCOUNTER — Ambulatory Visit: Payer: PPO | Admitting: Neurology

## 2019-03-25 VITALS — BP 133/75 | HR 85 | Temp 98.1°F | Ht 67.0 in | Wt 158.5 lb

## 2019-03-25 DIAGNOSIS — E119 Type 2 diabetes mellitus without complications: Secondary | ICD-10-CM | POA: Diagnosis not present

## 2019-03-25 DIAGNOSIS — R413 Other amnesia: Secondary | ICD-10-CM | POA: Diagnosis not present

## 2019-03-25 DIAGNOSIS — R531 Weakness: Secondary | ICD-10-CM | POA: Diagnosis not present

## 2019-03-25 DIAGNOSIS — I10 Essential (primary) hypertension: Secondary | ICD-10-CM | POA: Diagnosis not present

## 2019-03-25 NOTE — Progress Notes (Addendum)
PATIENT: Tammy Mcknight DOB: September 29, 1955  HISTORICAL  Tammy Mcknight is a 63 year old female, seen in request by her primary care Dr. Hillis Range evaluation of dizziness, unsteady gait,  I have reviewed and summarized the referring note from the referring physician.  Also revealed multiple [facility] chart, patient was able to provide limited detail in her previous injury,  She fell off her porch while feeding the bird in 2014, when she awakened she felt confused, but was able to reach out to her husband to call for help, was initially treated at [facility], later was transferred to [facility], per record, she suffered a ruptured spleen, head injury, she received massive blood transfusion, also reported acute renal failure, per patient, during hospital stay she also suffered cardiac arrest  She also had a history of right hip replacement in May 2019,  She used to work in home health aide, 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   I reviewed outside record, MRI of brain in June 2017, right maxillary sinus is opacified with chronic inflammatory changes, there also other evidence of chronic sinusitis, in left maxillary sinus.  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  There was no evidence of acute ischemic changes.   MRI of cervical spine showed degenerative changes, there was no significant  canal or foraminal narrowing  UPDATE December 02 2018 She is accompanied by her mother-in-law Tammy Mcknight at today's visit, who provides supplementary history,  Since falling off porch of 10 feet in 2014, patient was noted to have significant memory loss, gradually getting worse, she had abdominal surgery, also had few cardiac arrest need resuscitation during the incident, since then, she has episode of transient dizziness, mild confusion, lasting less than 1 minute, no loss of consciousness, no seizure-like activity noticed, she is having no spells almost daily basis over the past few months,  She also has strong family history of dementia, both father and mother died of dementia in their 40s  She was noted to repeating herself, got lost while driving, and making comments that is not in line with reality,  We have personally reviewed MRI of the brain without contrast May 2020, mild changes of chronic microvascular ischemia, possible remote lacunar infarction in the right periventricular white matter, overall no change compared to previous MRI from September 2019   She was also noted to have significant atrophy, weakness of left hand, arm numbness involving left hand to left elbow level  UPDATE Mar 25 2019: She is accompanied by her mother-in-law at today's clinic visit, She continues to decline slowly, mild worsening gait abnormality, mental slowing, confusion, difficult to keep up with her medications,  Pulmonary functional test in August 2020 The FVC, FEV1, FEV1/FVC ratio and FEF25-75% are within normal limits. Lung volumes are within normal limits. Following administration of bronchodilators, there is no significant response. The reduced diffusing capacity indicates a minimal loss of functional alveolar capillary surface. However, the diffusing capacity was not  corrected for the patient's hemoglobin. 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 Anemia cannot  be excluded as a potential cause of the diffusion defect without correcting the observed diffusing capacity for hemoglobin.  EMG nerve conduction study in August showed findings worrisome for motor neuron disease, there is also evidence of moderate right carpal tunnel syndrome.  Laboratory evaluations in June 2020 showed normal or negative RPR, X44, HIV, folic acid, C-reactive protein, TSH, CPK, ESR, CBC, CMP showed mild elevated glucose 123  I personally reviewed MRI of cervical spine in June 2020: Mild multilevel degenerative changes, there was no significant canal or foraminal stenosis  REVIEW OF SYSTEMS: Full 14 system review of systems performed and notable only for as above All other review of systems were negative.  ALLERGIES: No Known Allergies  HOME MEDICATIONS: Current Outpatient Medications  Medication Sig Dispense Refill  . atorvastatin (LIPITOR) 40 MG tablet Take 40 mg by mouth daily.    . Cholecalciferol (VITAMIN D3 PO) Take 1,000 Units by mouth daily.    . cinacalcet (SENSIPAR) 30 MG tablet Take 30 mg by mouth 3 (three) times a week.     . citalopram (CELEXA) 40 MG tablet Take 40 mg by mouth daily.    Marland Kitchen labetalol (NORMODYNE) 200 MG tablet Take 400 mg by mouth 2 (two) times daily.    Marland Kitchen lamoTRIgine (LAMICTAL) 100 MG tablet Take 1 tablet (100 mg total) by mouth 2 (two) times daily. 60 tablet 11  . memantine (NAMENDA) 10 MG tablet Take 1 tablet (10 mg total) by mouth 2 (two) times daily. 60 tablet 11  . valsartan (DIOVAN) 320 MG tablet TAKE 1 TABLET BY MOUTH IN THE MORNING FOR HIGH BLOOD PRESSURE     No current facility-administered medications for this visit.     PAST MEDICAL HISTORY: Past Medical History:  Diagnosis Date  . Acne rosacea   . Anoxic brain injury ([place])   . Arthritis   . Ataxia   . Bilateral carpal tunnel syndrome   . Breast lesion on mammography    Left  . Bulging lumbar disc   . Cardiac arrest due to trauma [facility])    From a fall off of her porch  2014  . Carpal tunnel syndrome of left wrist   . Cervical radiculopathy at C8   . 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 Depression   . 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 Dizziness due to old head injury   . Elevated liver enzymes   . Generalized osteoarthritis of multiple sites   . GERD (gastroesophageal reflux disease)   . Herniated nucleus pulposus, C4-5 left   . Hypercalcemia   . Hypertension   . Hyperthyroidism   . 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 Left hand weakness   . Lumbar radiculopathy   . Memory loss   . Menopausal syndrome   . 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 Pharyngeal dysphagia   . RLS (restless legs syndrome)   . Thyroid nodule     PAST SURGICAL HISTORY: Past Surgical History:  Procedure Laterality Date  . ABDOMINAL HYSTERECTOMY    . BREAST LUMPECTOMY WITH RADIOACTIVE SEED LOCALIZATION Left 05/13/2018   Procedure: LEFT BREAST LUMPECTOMY WITH RADIOACTIVE SEED LOCALIZATION;  Surgeon: Jovita Kussmaul, MD;  Location: [facility];  Service: General;  Laterality: Left;  . BREAST SURGERY     right benign lump  . HIP ARTHROPLASTY     Right  .  SPLENECTOMY, TOTAL    . TONSILLECTOMY    . TONSILLECTOMY AND ADENOIDECTOMY    . TUBAL LIGATION      FAMILY HISTORY: Family History  Problem Relation Age of Onset  . Heart failure Mother   . Dementia Mother   . Hypertension Mother   . Fibromyalgia Mother   . Migraines Mother   . Lupus Mother   . Dementia Father   . Hypertension Father   . Leukemia Father     SOCIAL HISTORY:   Social History   Socioeconomic History  . Marital status: Married    Spouse name: Not on file  . Number of children: 2  . Years of education: GED  . Highest education level: Not on file  Occupational History  . Occupation: Diabled  Social Needs  . Financial resource strain: Not on file  . Food insecurity    Worry: Not on file    Inability: Not on file  . Transportation needs    Medical: Not on file     Non-medical: Not on file  Tobacco Use  . Smoking status: Never Smoker  . Smokeless tobacco: Never Used  Substance and Sexual Activity  . Alcohol use: Not Currently  . Drug use: Never  . Sexual activity: Not on file  Lifestyle  . Physical activity    Days per week: Not on file    Minutes per session: Not on file  . Stress: Not on file  Relationships  . Social Herbalist on phone: Not on file    Gets together: Not on file    Attends religious service: Not on file    Active member of club or organization: Not on file    Attends meetings of clubs or organizations: Not on file    Relationship status: Not on file  . Intimate partner violence    Fear of current or ex partner: Not on file    Emotionally abused: Not on file    Physically abused: Not on file    Forced sexual activity: Not on file  Other Topics Concern  . Not on file  Social History Narrative   Lives at home with her husband.   Right-handed.   Occasional tea.    PHYSICAL EXAMNIATION:  Gen: NAD, conversant, well nourised, obese, well groomed                     Cardiovascular: Regular rate rhythm, no peripheral edema, warm, nontender. Eyes: Conjunctivae clear without exudates or hemorrhage Neck: Supple, no carotid bruits. Pulmonary: Clear to auscultation bilaterally   NEUROLOGICAL EXAM:  MMSE - Mini Mental State Exam 03/25/2019  Orientation to time 5  Orientation to Place 5  Registration 3  Attention/ Calculation 5  Recall 2  Language- name 2 objects 2  Language- repeat 1  Language- follow 3 step command 3  Language- read & follow direction 1  Write a sentence 1  Copy design 0  Total score 28  animal naming 8   CRANIAL NERVES: CN II: Visual fields are full to confrontation.  Pupils are round equal and briskly reactive to light. CN III, IV, VI: extraocular movement are normal. No ptosis. CN V: Facial sensation is intact to pinprick in all 3 divisions bilaterally. Corneal responses are  intact.  CN VII: Face is symmetric with normal eye closure and smile. CN VIII: Hearing is normal to rubbing fingers CN IX, X: Palate elevates symmetrically. Phonation is normal. CN XI: Head turning  and shoulder shrug are intact CN XII: Tongue is midline with normal movements and no atrophy.  MOTOR: Significant left hand intrinsic muscle atrophy, bilateral shoulder abduction R/L 4/4, elbow flexion5-/4+, elbow extension 4+/4, wrist flexion5/3, wrist extension 5/3, grip4/3, bilateral hip flexion weakness  REFLEXES: Reflexes are 2+ and symmetric at the biceps, triceps, knees, and ankles. Plantar responses are extensor bilaterally  SENSORY: No significant sensory loss  COORDINATION: Rapid alternating movements and fine finger movements are intact. There is no dysmetria on finger-to-nose and heel-knee-shin.    GAIT/STANCE: She could not get up from seated position arms crossed, need to push on chair, mildly unsteady  Assessment plan: History of brain injury, cardiac arrest in 2014 Recurrent spells of transient loss of consciousness,  Possible partial seizure, improved with lamotrigine 100 mg twice a day  EEG in June 2020 showed no significant abnormality.   Left hand muscle atrophy, weakness  EMG nerve conduction study showed findings worrisome for motor neuron disease  MRI of cervical spine, laboratory evaluation showed no treatable etiology  To observe her symptoms  Gradual worsening memory loss  Most related to her anoxic brain injury, brain trauma from fall  Continue Namenda 10 mg twice a day   Marcial Pacas, M.D. Ph.D.  [facility] [address], [place], [place] [address] Ph: [phone] Fax: [phone]  CC: Algis Greenhouse, MD

## 2019-04-29 DIAGNOSIS — L089 Local infection of the skin and subcutaneous tissue, unspecified: Secondary | ICD-10-CM | POA: Diagnosis not present

## 2019-04-29 DIAGNOSIS — I10 Essential (primary) hypertension: Secondary | ICD-10-CM | POA: Diagnosis not present

## 2019-04-30 DIAGNOSIS — E041 Nontoxic single thyroid nodule: Secondary | ICD-10-CM | POA: Diagnosis not present

## 2019-05-09 DIAGNOSIS — E119 Type 2 diabetes mellitus without complications: Secondary | ICD-10-CM | POA: Diagnosis not present

## 2019-05-14 DIAGNOSIS — E041 Nontoxic single thyroid nodule: Secondary | ICD-10-CM | POA: Diagnosis not present

## 2019-05-19 DIAGNOSIS — N3592 Unspecified urethral stricture, female: Secondary | ICD-10-CM | POA: Diagnosis not present

## 2019-05-19 DIAGNOSIS — R300 Dysuria: Secondary | ICD-10-CM | POA: Diagnosis not present

## 2019-06-25 DIAGNOSIS — I10 Essential (primary) hypertension: Secondary | ICD-10-CM | POA: Diagnosis not present

## 2019-06-25 DIAGNOSIS — N183 Chronic kidney disease, stage 3 unspecified: Secondary | ICD-10-CM | POA: Diagnosis not present

## 2019-06-25 DIAGNOSIS — E213 Hyperparathyroidism, unspecified: Secondary | ICD-10-CM | POA: Diagnosis not present

## 2019-07-03 DIAGNOSIS — I10 Essential (primary) hypertension: Secondary | ICD-10-CM | POA: Diagnosis not present

## 2019-07-03 DIAGNOSIS — N390 Urinary tract infection, site not specified: Secondary | ICD-10-CM | POA: Diagnosis not present

## 2019-07-03 DIAGNOSIS — N183 Chronic kidney disease, stage 3 unspecified: Secondary | ICD-10-CM | POA: Diagnosis not present

## 2019-08-07 DIAGNOSIS — N6320 Unspecified lump in the left breast, unspecified quadrant: Secondary | ICD-10-CM | POA: Insufficient documentation

## 2019-09-01 ENCOUNTER — Telehealth: Payer: Self-pay

## 2019-09-01 NOTE — Telephone Encounter (Signed)
She is concerned about continued episodes of intermittent spells of lightheadedness and head pressure. No other episodes of loss of consciousness or loss of time. Feels her memory is worse. She has a pending appt with Butler Denmark, NP on 09/02/19 to further discuss symptoms. She has continued taking both lamotrigine and memantine, as prescribed.

## 2019-09-01 NOTE — Telephone Encounter (Signed)
Voicemail left at 9:44a:   Patient is scheduled to see Dr. Krista Blue on 4/27 but is requesting a sooner appt if possible, she did not leave a reason why.

## 2019-09-01 NOTE — Progress Notes (Signed)
PATIENT: Tammy Mcknight DOB: 10/04/1955  REASON FOR VISIT: follow up HISTORY FROM: patient  HISTORY OF PRESENT ILLNESS: Today 09/02/19  HISTORY Tammy Mcknight is a 64 year old female, seen in request by her primary care Dr. Herbie Baltimore Mcknight evaluation of dizziness, unsteady gait,  I have reviewed and summarized the referring note from the referring physician.  Also revealed multiple [facility] chart, patient was able to provide limited detail in her previous injury,  She fell off her porch while feeding the bird in 2014, when she awakened she felt confused, but was able to reach out to her husband to call for help, was initially treated at [facility], later was transferred to [facility], per record, she suffered a ruptured spleen, head injury, she received massive blood transfusion, also reported acute renal failure, per patient, during hospital stay she also suffered cardiac arrest  She also had a history of right hip replacement in May 2019,  She used to work in home health aide, 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   I reviewed outside record, MRI of brain in June 2017, right maxillary sinus is opacified with chronic inflammatory changes, there also other evidence of chronic sinusitis, in left maxillary sinus.  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  There was no evidence of acute  ischemic changes.   MRI of cervical spine showed degenerative changes, there was no significant canal or foraminal narrowing  UPDATE December 02 2018 She is accompanied by her mother-in-law Tammy Mcknight at today's visit, who provides supplementary history,  Since falling off porch of 10 feet in 2014, patient was noted to have significant memory loss, gradually getting worse, she had abdominal surgery, also had few cardiac arrest need resuscitation during the incident, since then, she has episode of transient dizziness, mild confusion, lasting less than 1 minute, no loss of consciousness, no seizure-like activity noticed, she is having no spells almost daily basis over the past few months,  She also has strong family history of dementia, both father and mother died of dementia in their 58s  She was noted to repeating herself, got lost while driving, and making comments that is not in line with reality,  We have personally reviewed MRI of the brain without contrast May 2020, mild changes of chronic microvascular ischemia, possible remote lacunar infarction in the right periventricular white matter, overall no change compared to previous MRI from September 2019   She was also noted to have significant atrophy, weakness of left hand, arm numbness involving left hand to left elbow level  UPDATE Mar 25 2019: She is accompanied by her mother-in-law at today's clinic visit, She continues to decline slowly, mild worsening gait abnormality, mental slowing, confusion, difficult to keep up with her medications,  Pulmonary functional test in August 2020 The FVC, FEV1, FEV1/FVC ratio and FEF25-75% are within normal limits. Lung volumes are within normal limits. Following administration of bronchodilators, there is no significant response. The reduced diffusing  capacity indicates a minimal loss of functional alveolar capillary surface. However, the diffusing capacity was not corrected for the  patient's hemoglobin. 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 Anemia cannot be excluded as a potential cause of the diffusion defect without correcting the observed diffusing capacity for hemoglobin.  EMG nerve conduction study in August showed findings worrisome for motor neuron disease, there is also evidence of moderate right carpal tunnel syndrome.  Laboratory evaluations in June 2020 showed normal or negative RPR, P91, HIV, folic acid, C-reactive protein, TSH, CPK, ESR, CBC, CMP showed mild elevated glucose 123  I personally reviewed MRI of cervical spine in June 2020: Mild multilevel degenerative changes, there was no significant canal or foraminal stenosis  Update September 02, 2019 SS: Here with her husband.  Remains on Lamictal 100 mg twice a day, Namenda 10 mg twice a day.  Initially when on medications, they helped the dizziness.  For the last month or so, has been feeling more dizzy, lightheaded, pressure in her head, fairly constant, no headache, but feels air blowing in her head.  Feels balance is some what off, the urge to hold onto to objects, had 1 fall at church recently collided into little boy.  At times, may be quick tempered to her husband, and not realize it.  She feels the left hand atrophy is stable, does feel weakness all over. I probed her about what is new, feels the previous spells have reoccurred, has had since 2014 from her accident.  She sometimes feels short of breath, her husband thinks she needs oxygen.  REVIEW OF SYSTEMS: Out of a complete 14 system review of symptoms, the patient complains only of the following symptoms, and all other reviewed systems are negative.  Weakness, dizziness  ALLERGIES: No Known Allergies  HOME MEDICATIONS: Outpatient Medications Prior to Visit  Medication Sig Dispense Refill   atorvastatin (LIPITOR) 40 MG tablet Take 40 mg by mouth daily.     Cholecalciferol (VITAMIN D3 PO) Take 1,000  Units by mouth daily.     cinacalcet (SENSIPAR) 30 MG tablet Take 30 mg by mouth 3 (three) times a week.      citalopram (CELEXA) 40 MG tablet Take 40 mg by mouth daily.     labetalol (NORMODYNE) 200 MG tablet Take 400 mg by mouth 2 (two) times daily.     lamoTRIgine (LAMICTAL) 100 MG tablet Take 1 tablet (100 mg total) by mouth 2 (two) times daily. 60 tablet 11   memantine (NAMENDA) 10 MG tablet Take 1 tablet (10 mg total) by mouth 2 (two) times daily. 60 tablet 11   valsartan (DIOVAN) 320 MG tablet TAKE 1 TABLET BY MOUTH IN THE MORNING FOR HIGH BLOOD PRESSURE     No facility-administered medications prior to visit.    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    Depression    Diabetes mellitus without complication (HCC)    Tyoe II   Dizziness due to old head  injury    Elevated liver enzymes    Generalized osteoarthritis of multiple sites    GERD (gastroesophageal reflux disease)    Herniated nucleus pulposus, C4-5 left    Hypercalcemia    Hypertension    Hyperthyroidism    Hypoxemic encephalopathy (HCC)    Left hand weakness    Lumbar radiculopathy    Memory loss    Menopausal syndrome    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HCC)    Pharyngeal dysphagia    RLS (restless legs syndrome)    Thyroid nodule     PAST SURGICAL HISTORY: Past Surgical History:  Procedure Laterality Date   ABDOMINAL HYSTERECTOMY     BREAST LUMPECTOMY WITH RADIOACTIVE SEED LOCALIZATION Left 05/13/2018   Procedure: LEFT BREAST LUMPECTOMY WITH RADIOACTIVE SEED LOCALIZATION;  Surgeon: Jovita Kussmaul, MD;   Location: [facility] OR;  Service: General;  Laterality: Left;   BREAST SURGERY     right benign lump   HIP ARTHROPLASTY     Right   SPLENECTOMY, TOTAL     TONSILLECTOMY     TONSILLECTOMY AND ADENOIDECTOMY     TUBAL LIGATION      FAMILY HISTORY: Family History  Problem Relation Age of Onset   Heart failure Mother    Dementia Mother    Hypertension Mother    Fibromyalgia Mother    Migraines Mother    Lupus Mother    Dementia Father    Hypertension Father    Leukemia Father     SOCIAL HISTORY: Social History   Socioeconomic History   Marital status: Married    Spouse name: Not on file   Number of children: 2   Years of education: GED   Highest education level: Not on file  Occupational History   Occupation: Diabled  Tobacco Use   Smoking status: Never Smoker   Smokeless tobacco: Never Used  Substance and Sexual Activity   Alcohol use: Not Currently   Drug use: Never   Sexual activity: Not on file  Other Topics Concern   Not on file  Social History Narrative   Lives at home with her husband.   Right-handed.   Occasional tea.   Social Determinants of Health   Financial Resource Strain:    Difficulty of Paying Living Expenses:   Food Insecurity:    Worried About Charity fundraiser in the Last Year:    Arboriculturist in the Last Year:   Transportation Needs:    Film/video editor (Medical):    Lack of Transportation (Non-Medical):   Physical Activity:    Days of Exercise per Week:    Minutes of Exercise per Session:   Stress:    Feeling of Stress :   Social Connections:    Frequency of Communication with Friends and Family:    Frequency of Social Gatherings with Friends and Family:    Attends Religious Services:    Active Member of Clubs or Organizations:    Attends Archivist Meetings:    Marital Status:   Intimate Partner Violence:    Fear of Current or Ex-Partner:    Emotionally Abused:     Physically Abused:    Sexually Abused:    PHYSICAL EXAM  Vitals:   09/02/19 0931  BP: 122/68  Pulse: 77  Temp: 97.9 F (36.6 C)  Weight: 152 lb (68.9 kg)  Mcknight: '5\' 7"'  (1.702 m)   Body mass index is 23.81 kg/m.  Generalized: Well developed, in no acute distress  MMSE -  Mini Mental State Exam 09/02/2019 03/25/2019  Orientation to time 4 5  Orientation to Place 4 5  Registration 3 3  Attention/ Calculation 0 5  Recall 2 2  Language- name 2 objects 2 2  Language- repeat 0 1  Language- follow 3 step command 3 3  Language- read & follow direction 1 1  Write a sentence 1 1  Copy design 0 0  Total score 20 28    Neurological examination  Mentation: Alert oriented to time, place, history taking. Follows all commands speech and language fluent, smiling, very pleasant Cranial nerve II-XII: Pupils were equal round reactive to light. Extraocular movements were full, visual field were full on confrontational test. Facial sensation and strength were normal. Head turning and shoulder shrug  were normal and symmetric. Motor: Significant left hand intrinsic muscle atrophy, bilateral hip flexion weakness, left grip 3/5, right grip 4/5, 4/5 shoulder abduction bilaterally Sensory: Sensory testing is intact to soft touch on all 4 extremities. No evidence of extinction is noted.  Coordination: Cerebellar testing reveals good finger-nose-finger and heel-to-shin bilaterally, mild difficulty performing finger-nose-finger on the left due to atrophy Gait and station: Gait is cautious, very intentional, mild lean occasional to the right with right limp Reflexes: Deep tendon reflexes are symmetric and normal bilaterally.   DIAGNOSTIC DATA (LABS, IMAGING, TESTING) - I reviewed patient records, labs, notes, testing and imaging myself where available.  Lab Results  Component Value Date   WBC 10.9 (H) 12/02/2018   HGB 13.9 12/02/2018   HCT 42.2 12/02/2018   MCV 89 12/02/2018   PLT 307 12/02/2018       Component Value Date/Time   NA 142 12/02/2018 1119   K 5.1 12/02/2018 1119   CL 105 12/02/2018 1119   CO2 20 12/02/2018 1119   GLUCOSE 123 (H) 12/02/2018 1119   GLUCOSE 126 (H) 05/02/2018 1536   BUN 21 12/02/2018 1119   CREATININE 0.63 12/02/2018 1119   CALCIUM 9.8 12/02/2018 1119   PROT 6.5 12/02/2018 1119   ALBUMIN 4.9 (H) 12/02/2018 1119   AST 41 (H) 12/02/2018 1119   ALT 69 (H) 12/02/2018 1119   ALKPHOS 85 12/02/2018 1119   BILITOT 0.9 12/02/2018 1119   GFRNONAA 95 12/02/2018 1119   GFRAA 110 12/02/2018 1119   No results found for: CHOL, HDL, LDLCALC, LDLDIRECT, TRIG, CHOLHDL Lab Results  Component Value Date   HGBA1C 6.4 (H) 05/02/2018   Lab Results  Component Value Date   UXNATFTD32 202 12/02/2018   Lab Results  Component Value Date   TSH 2.970 12/02/2018      ASSESSMENT AND PLAN 64 y.o. 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 here with:  1.  History of brain injury, cardiac arrest in 2014 2.  Recurrent spells of transient loss of consciousness -Possible partial seizure, improved with lamotrigine 100 mg twice a day -EEG in June 2020 showed no significant abnormality -Today, reporting  for  the last month or so recurrent spells of dizziness, lightheadedness, feels generally weak, off balance -Not felt to need to repeat MRI or EEG at this time, Probing the patient, feels symptoms have been recurrent since initial injury in 2014 -I will check blood work today, including Lamictal level -MRI of the brain May 2020 showed small vessel disease, possible lacunar infarction in the right periventricular white matter, no significant change compared to September 2019  3.  Left hand muscle atrophy, weakness -EMG nerve conduction study showed findings worrisome for motor neuron disease -MRI of cervical spine, laboratory evaluation showed no treatable etiology -Left hand atrophy is stable per patient  4.  Gradual worsening memory loss -Most likely related to her anoxic brain injury, brain trauma from fall -Declining MMSE 20/30 today -Continue Namenda 10 mg twice a day -We will have patient follow-up in 2 months with Dr. Krista Blue for close evaluation of possible motor neuron disease  I spent 30 minutes of face-to-face and non-face-to-face time with patient.  This included previsit chart review, lab review, study review, order entry, electronic health record documentation, patient education.  Butler Denmark, AGNP-C, DNP 09/02/2019, 9:54 AM [facility] [address], [place], [place] [address] [phone]

## 2019-09-02 ENCOUNTER — Encounter: Payer: Self-pay | Admitting: Neurology

## 2019-09-02 ENCOUNTER — Other Ambulatory Visit: Payer: Self-pay

## 2019-09-02 ENCOUNTER — Ambulatory Visit: Payer: Medicare HMO | Admitting: Neurology

## 2019-09-02 VITALS — BP 122/68 | HR 77 | Temp 97.9°F | Ht 67.0 in | Wt 152.0 lb

## 2019-09-02 DIAGNOSIS — R29898 Other symptoms and signs involving the musculoskeletal system: Secondary | ICD-10-CM | POA: Diagnosis not present

## 2019-09-02 DIAGNOSIS — R413 Other amnesia: Secondary | ICD-10-CM

## 2019-09-02 DIAGNOSIS — R42 Dizziness and giddiness: Secondary | ICD-10-CM | POA: Diagnosis not present

## 2019-09-02 NOTE — Patient Instructions (Addendum)
Check blood work today  Continue on current medications  Will update once blood work results

## 2019-09-03 ENCOUNTER — Encounter: Payer: Self-pay | Admitting: Neurology

## 2019-09-03 LAB — CBC WITH DIFFERENTIAL/PLATELET
Basophils Absolute: 0.1 10*3/uL (ref 0.0–0.2)
Basos: 1 %
EOS (ABSOLUTE): 0.2 10*3/uL (ref 0.0–0.4)
Eos: 1 %
Hematocrit: 42 % (ref 34.0–46.6)
Hemoglobin: 13.9 g/dL (ref 11.1–15.9)
Immature Grans (Abs): 0 10*3/uL (ref 0.0–0.1)
Immature Granulocytes: 0 %
Lymphocytes Absolute: 3.2 10*3/uL — ABNORMAL HIGH (ref 0.7–3.1)
Lymphs: 24 %
MCH: 30.8 pg (ref 26.6–33.0)
MCHC: 33.1 g/dL (ref 31.5–35.7)
MCV: 93 fL (ref 79–97)
Monocytes Absolute: 1 10*3/uL — ABNORMAL HIGH (ref 0.1–0.9)
Monocytes: 7 %
Neutrophils Absolute: 8.8 10*3/uL — ABNORMAL HIGH (ref 1.4–7.0)
Neutrophils: 67 %
Platelets: 310 10*3/uL (ref 150–450)
RBC: 4.52 x10E6/uL (ref 3.77–5.28)
RDW: 12.5 % (ref 11.7–15.4)
WBC: 13.3 10*3/uL — ABNORMAL HIGH (ref 3.4–10.8)

## 2019-09-03 LAB — COMPREHENSIVE METABOLIC PANEL
ALT: 28 IU/L (ref 0–32)
AST: 20 IU/L (ref 0–40)
Albumin/Globulin Ratio: 3 — ABNORMAL HIGH (ref 1.2–2.2)
Albumin: 4.5 g/dL (ref 3.8–4.8)
Alkaline Phosphatase: 82 IU/L (ref 39–117)
BUN/Creatinine Ratio: 26 (ref 12–28)
BUN: 19 mg/dL (ref 8–27)
Bilirubin Total: 0.8 mg/dL (ref 0.0–1.2)
CO2: 23 mmol/L (ref 20–29)
Calcium: 9.6 mg/dL (ref 8.7–10.3)
Chloride: 108 mmol/L — ABNORMAL HIGH (ref 96–106)
Creatinine, Ser: 0.74 mg/dL (ref 0.57–1.00)
GFR calc Af Amer: 98 mL/min/{1.73_m2} (ref >59)
GFR calc non Af Amer: 85 mL/min/{1.73_m2} (ref >59)
Globulin, Total: 1.5 g/dL (ref 1.5–4.5)
Glucose: 117 mg/dL — ABNORMAL HIGH (ref 65–99)
Potassium: 4.7 mmol/L (ref 3.5–5.2)
Sodium: 144 mmol/L (ref 134–144)
Total Protein: 6 g/dL (ref 6.0–8.5)

## 2019-09-03 LAB — LAMOTRIGINE LEVEL: Lamotrigine Lvl: 6.5 ug/mL (ref 2.0–20.0)

## 2019-09-04 ENCOUNTER — Telehealth: Payer: Self-pay | Admitting: Neurology

## 2019-09-04 NOTE — Telephone Encounter (Signed)
I called pt and relayed that the lab results showed elevated WBC, per pt no s/s infection.  Has appt with pcp tomorrow and will have bloodwork done.  She verbalized understanding of results.  Lamotrigine level was normal.  kee same dose.  Will see Dr. Krista Blue in June as already scheduled.

## 2019-09-04 NOTE — Progress Notes (Signed)
I have reviewed and agreed above plan. 

## 2019-09-04 NOTE — Telephone Encounter (Signed)
Please call the patient.  Labs show elevated WBC 13.3, I question underlying infection, any symptoms of UTI, respiratory, fever, chills? Please discuss with PCP, infection could contribute to symptoms of lightheaded. Lamictal level is within normal range, let's stay at same dose for now. Keep 2 month f/u appointment with Dr. Krista Blue.

## 2019-09-18 DIAGNOSIS — D72829 Elevated white blood cell count, unspecified: Secondary | ICD-10-CM

## 2019-10-07 ENCOUNTER — Ambulatory Visit: Payer: PPO | Admitting: Neurology

## 2019-10-09 DIAGNOSIS — M546 Pain in thoracic spine: Secondary | ICD-10-CM | POA: Insufficient documentation

## 2019-10-09 DIAGNOSIS — S46812A Strain of other muscles, fascia and tendons at shoulder and upper arm level, left arm, initial encounter: Secondary | ICD-10-CM | POA: Insufficient documentation

## 2019-11-24 ENCOUNTER — Other Ambulatory Visit: Payer: Self-pay

## 2019-11-24 ENCOUNTER — Ambulatory Visit: Payer: Medicare HMO | Admitting: Neurology

## 2019-11-24 ENCOUNTER — Encounter: Payer: Self-pay | Admitting: Neurology

## 2019-11-24 VITALS — BP 120/70 | HR 75 | Ht 67.0 in | Wt 154.5 lb

## 2019-11-24 DIAGNOSIS — M6281 Muscle weakness (generalized): Secondary | ICD-10-CM | POA: Diagnosis not present

## 2019-11-24 DIAGNOSIS — R413 Other amnesia: Secondary | ICD-10-CM | POA: Diagnosis not present

## 2019-11-24 DIAGNOSIS — R29898 Other symptoms and signs involving the musculoskeletal system: Secondary | ICD-10-CM | POA: Diagnosis not present

## 2019-11-24 MED ORDER — MEMANTINE HCL 10 MG PO TABS: 10.0000 mg | ORAL_TABLET | Freq: Two times a day (BID) | ORAL | 4 refills | Status: DC

## 2019-11-24 MED ORDER — LAMOTRIGINE 100 MG PO TABS: 100.0000 mg | ORAL_TABLET | Freq: Two times a day (BID) | ORAL | 4 refills | Status: DC

## 2019-11-24 NOTE — Progress Notes (Signed)
PATIENT: Tammy Mcknight DOB: 12-11-1955  HISTORICAL  Tammy Mcknight Tammy Mcknight is a 64 year old female, seen in request by her primary care Dr. Hillis Range evaluation of dizziness, unsteady gait,  I have reviewed and summarized the referring note from the referring physician.  Also revealed multiple [facility] chart, patient was able to provide limited detail in her previous injury,  She fell off her porch while feeding the bird in 2014, when she awakened she felt confused, but was able to reach out to her husband to call for help, was initially treated at [facility], later was transferred to [facility], per record, she suffered a ruptured spleen, head injury, she received massive blood transfusion, also reported acute renal failure, per patient, during hospital stay she also suffered cardiac arrest  She also had a history of right hip replacement in May 2019,  She used to work in home health aide, 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   I reviewed outside record, MRI of brain in June 2017, right maxillary sinus is opacified with chronic inflammatory changes, there also other evidence of chronic sinusitis, in left maxillary sinus.  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  There was no evidence of acute ischemic changes.   MRI of cervical spine showed degenerative changes, there was  no significant canal or foraminal narrowing  UPDATE December 02 2018 She is accompanied by her mother-in-law [name] at today's visit, who provides supplementary history,  Since falling off porch of 10 feet in 2014, patient was noted to have significant memory loss, gradually getting worse, she had abdominal surgery, also had few cardiac arrest need resuscitation during the incident, since then, she has episode of transient dizziness, mild confusion, lasting less than 1 minute, no loss of consciousness, no seizure-like activity noticed, she is having recurrect spells almost daily basis over the past few months,  She also has strong family history of dementia, both father and mother died of dementia in their 71s  She was noted to repeating herself, got lost while driving, and making comments that is not in line with reality,  We have personally reviewed MRI of the brain without contrast May 2020, mild changes of chronic microvascular ischemia, possible remote lacunar infarction in the right periventricular white matter, overall no change compared to previous MRI from September 2019   She was also noted to have significant atrophy, weakness of left hand, arm numbness involving left hand to left elbow level  UPDATE Mar 25 2019: She is accompanied by her mother-in-law at today's clinic visit, She continues to decline slowly, mild worsening gait abnormality, mental slowing, confusion, difficult to keep up with her medications,  Pulmonary functional test in August 2020 The FVC, FEV1, FEV1/FVC ratio and FEF25-75% are within normal limits. Lung volumes are within normal limits. Following administration of bronchodilators, there is no significant response. The reduced diffusing capacity indicates a minimal loss of functional alveolar capillary surface. However, the diffusing capacity  was not corrected for the patient's hemoglobin. 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 Anemia cannot be excluded as a potential cause of the diffusion defect without correcting the observed diffusing capacity for hemoglobin.  EMG nerve conduction study in August showed findings worrisome for motor neuron disease, there is also evidence of moderate right carpal tunnel syndrome.  Laboratory evaluations in June 2020 showed normal or negative RPR, K27, HIV, folic acid, C-reactive protein, TSH, CPK, ESR, CBC, CMP showed mild elevated glucose 123  I personally reviewed MRI of cervical spine in June 2020: Mild multilevel degenerative changes, there was no significant canal or foraminal stenosis  UPDATE November 24 2019: She is accompanied by her husband at today's clinical visit, there was no significant change, continue has gait abnormality, muscle atrophy, memory loss  Previous EMG nerve conduction study August 2020 showed evidence of chronic neuropathic changes involving bilateral cervical, lumbar sacral myotomes, we will repeat EMG nerve conduction study    REVIEW OF SYSTEMS: Full 14 system review of systems performed and notable only for as above All other review of systems were negative.  ALLERGIES: No Known Allergies  HOME MEDICATIONS: Current Outpatient Medications  Medication Sig Dispense Refill  . atorvastatin (LIPITOR) 40 MG tablet Take 40 mg by mouth daily.    . Cholecalciferol (VITAMIN D3 PO) Take 1,000 Units by mouth daily.    . cinacalcet (SENSIPAR) 30 MG tablet Take 30 mg by mouth 3 (three) times a week.     . citalopram (CELEXA) 40 MG tablet Take 40 mg by mouth daily.    Marland Kitchen labetalol (NORMODYNE) 200 MG tablet Take 400 mg by mouth 2 (two) times daily.    Marland Kitchen lamoTRIgine (LAMICTAL) 100 MG tablet Take 1 tablet (100 mg total) by mouth 2 (two) times daily. 60 tablet 11  . memantine (NAMENDA) 10 MG tablet Take 1 tablet (10 mg total) by mouth 2 (two) times daily. 60 tablet 11  . valsartan (DIOVAN) 320 MG tablet TAKE 1 TABLET BY MOUTH IN THE MORNING FOR HIGH BLOOD  PRESSURE     No current facility-administered medications for this visit.    PAST MEDICAL HISTORY: Past Medical History:  Diagnosis Date  . Acne rosacea   . Anoxic brain injury ([place])   . Arthritis   . Ataxia   . Bilateral carpal tunnel syndrome   . Breast lesion on mammography    Left  . Bulging lumbar disc   . Cardiac arrest due to trauma [facility])    From a fall off of her porch 2014  . Carpal tunnel syndrome of left wrist   . Cervical radiculopathy at C8   . 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 Depression   . 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 Dizziness due to old head injury   . Elevated liver enzymes   . Generalized osteoarthritis of multiple sites   . GERD (gastroesophageal reflux disease)   . Herniated nucleus pulposus, C4-5 left   . Hypercalcemia   . Hypertension   . Hyperthyroidism   . 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 Left hand weakness   . Lumbar radiculopathy   . Memory loss   . Menopausal syndrome   . 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 Pharyngeal dysphagia   . RLS (restless legs syndrome)   . Thyroid nodule     PAST SURGICAL HISTORY: Past Surgical History:  Procedure Laterality Date  . ABDOMINAL HYSTERECTOMY    .  BREAST LUMPECTOMY WITH RADIOACTIVE SEED LOCALIZATION Left 05/13/2018   Procedure: LEFT BREAST LUMPECTOMY WITH RADIOACTIVE SEED LOCALIZATION;  Surgeon: Jovita Kussmaul, MD;  Location: [facility];  Service: General;  Laterality: Left;  . BREAST SURGERY     right benign lump  . HIP ARTHROPLASTY     Right  . SPLENECTOMY, TOTAL    . TONSILLECTOMY    . TONSILLECTOMY AND ADENOIDECTOMY    . TUBAL LIGATION      FAMILY HISTORY: Family History  Problem Relation Age of Onset  . Heart failure Mother   . Dementia Mother   . Hypertension Mother   . Fibromyalgia Mother   . Migraines Mother   . Lupus Mother   . Dementia Father   . Hypertension Father   . Leukemia Father     SOCIAL HISTORY:   Social History   Socioeconomic  History  . Marital status: Married    Spouse name: Not on file  . Number of children: 2  . Years of education: GED  . Highest education level: Not on file  Occupational History  . Occupation: Diabled  Tobacco Use  . Smoking status: Never Smoker  . Smokeless tobacco: Never Used  Vaping Use  . Vaping Use: Never used  Substance and Sexual Activity  . Alcohol use: Not Currently  . Drug use: Never  . Sexual activity: Not on file  Other Topics Concern  . Not on file  Social History Narrative   Lives at home with her husband.   Right-handed.   Occasional tea.   Social Determinants of Health   Financial Resource Strain:   . Difficulty of Paying Living Expenses:   Food Insecurity:   . Worried About Charity fundraiser in the Last Year:   . Arboriculturist in the Last Year:   Transportation Needs:   . Film/video editor (Medical):   Marland Kitchen Lack of Transportation (Non-Medical):   Physical Activity:   . Days of Exercise per Week:   . Minutes of Exercise per Session:   Stress:   . Feeling of Stress :   Social Connections:   . Frequency of Communication with Friends and Family:   . Frequency of Social Gatherings with Friends and Family:   . Attends Religious Services:   . Active Member of Clubs or Organizations:   . Attends Archivist Meetings:   Marland Kitchen Marital Status:   Intimate Partner Violence:   . Fear of Current or Ex-Partner:   . Emotionally Abused:   Marland Kitchen Physically Abused:   . Sexually Abused:     PHYSICAL EXAMNIATION:  Gen: NAD, conversant, well nourised, obese, well groomed                     Cardiovascular: Regular rate rhythm, no peripheral edema, warm, nontender. Eyes: Conjunctivae clear without exudates or hemorrhage Neck: Supple, no carotid bruits. Pulmonary: Clear to auscultation bilaterally   NEUROLOGICAL EXAM:  MMSE - Mini Mental State Exam 09/02/2019 03/25/2019  Orientation to time 4 5  Orientation to Place 4 5  Registration 3 3  Attention/  Calculation 0 5  Recall 2 2  Language- name 2 objects 2 2  Language- repeat 0 1  Language- follow 3 step command 3 3  Language- read & follow direction 1 1  Write a sentence 1 1  Copy design 0 0  Total score 20 28  animal naming 8   CRANIAL NERVES: CN II: Visual fields are full  to confrontation.  Pupils are round equal and briskly reactive to light. CN III, IV, VI: extraocular movement are normal. No ptosis. CN V: Facial sensation is intact to pinprick in all 3 divisions bilaterally. Corneal responses are intact.  CN VII: Face is symmetric with normal eye closure and smile. CN VIII: Hearing is normal to rubbing fingers CN IX, X: Palate elevates symmetrically. Phonation is normal. CN XI: Head turning and shoulder shrug are intact CN XII: Tongue is midline with normal movements and no atrophy.  MOTOR: Significant left hand intrinsic muscle atrophy, bilateral shoulder abduction R/L 4/4, elbow flexion5-/4+, elbow extension 4+/4, wrist flexion5/3, wrist extension 5/3, grip4/3, bilateral hip flexion weakness  REFLEXES: Reflexes are 2+ and symmetric at the biceps, triceps, knees, and ankles. Plantar responses are extensor bilaterally  SENSORY: No significant sensory loss  COORDINATION: Rapid alternating movements and fine finger movements are intact. There is no dysmetria on finger-to-nose and heel-knee-shin.    GAIT/STANCE: She could not get up from seated position arms crossed, need to push on chair, mildly unsteady  Assessment plan: History of brain injury, cardiac arrest in 2014 Recurrent spells of transient loss of consciousness,  Possible partial seizure, improved with lamotrigine 100 mg twice a day  EEG in June 2020 showed no significant abnormality.   Left hand muscle atrophy, weakness  EMG nerve conduction study showed widespread chronic neuropathic changes involving bilateral cervical, left lumbar sacral myotomes,  MRI of cervical spine, laboratory evaluation showed no  treatable etiology  Repeat EMG nerve conduction study  Gradual worsening memory loss  Most related to her anoxic brain injury, brain trauma from fall  Continue Namenda 10 mg twice a day   Marcial Pacas, M.D. Ph.D.  [facility] [address] [place], [place] [address] Ph: [phone] Fax: [phone]  CC: Algis Greenhouse, MD

## 2019-12-14 ENCOUNTER — Other Ambulatory Visit: Payer: Self-pay | Admitting: Neurology

## 2019-12-31 ENCOUNTER — Ambulatory Visit: Payer: Medicare HMO | Admitting: Neurology

## 2019-12-31 ENCOUNTER — Ambulatory Visit (INDEPENDENT_AMBULATORY_CARE_PROVIDER_SITE_OTHER): Payer: Medicare HMO | Admitting: Neurology

## 2019-12-31 DIAGNOSIS — R413 Other amnesia: Secondary | ICD-10-CM | POA: Diagnosis not present

## 2019-12-31 DIAGNOSIS — M62532 Muscle wasting and atrophy, not elsewhere classified, left forearm: Secondary | ICD-10-CM

## 2019-12-31 DIAGNOSIS — M6281 Muscle weakness (generalized): Secondary | ICD-10-CM

## 2019-12-31 DIAGNOSIS — R29898 Other symptoms and signs involving the musculoskeletal system: Secondary | ICD-10-CM

## 2019-12-31 NOTE — Procedures (Signed)
Full Name: Tammy Mcknight Gender: Female MRN #: [id number] Date of Birth: 01-13-1956    Visit Date: 12/31/2019 08:32 Age: 64 Years Examining Physician: Marcial Pacas, MD  Referring Physician: Marcial Pacas, MD Height: 5 feet 7 inch History: 64 year old female presented with progressive worsening left hand muscle atrophy, weakness, there was no significant sensory loss  Summary of the tests:  Nerve conduction study: Left sural, superficial peroneal, bilateral ulnar sensory responses were normal.  Bilateral median sensory responses showed moderately prolonged peak latency with normal snap amplitude.  Left median motor response was absent.  Right median motor response showed mildly prolonged distal latency, with mildly decreased CMAP amplitude.  Left ulnar motor response showed significantly decreased CMAP amplitude.  Right ulnar, left peroneal to EDB, tibial motor responses were normal.  Left tibial, bilateral ulnar F-wave latency were normal.  Electromyography: Selective needle examination was performed at bilateral upper, lower extremity muscles, cervical, lumbar sacral paraspinal muscles.  There are clear evidence of active neuropathic changes involving left upper extremity muscles, also subtle chronic neuropathic changes involving lower extremity muscles.  There was no fasciculations staying, even at the very atrophic left hand, forearm muscles, there is mild denervation potentials.  Today's examination overall had no significant change compared to previous study in August 2020   Conclusion: This is an abnormal study.  There is electrodiagnostic evidence of active neuropathic changes involving left T1, C8, C7 myotomes more than proximal left cervical myotomes, sparing sensory component.  There was no significant change compared to the study in August 2020, differentiation diagnosis including multifocal motor neuropathy, cannot rule out the possibility of motor neuron  disease.    ------------------------------- Marcial Pacas, M.D. PhD  [facility] [address], [place], [place] [address] Tel: [phone] Fax: [phone]  Verbal informed consent was obtained from the patient, patient was informed of potential risk of procedure, including bruising, bleeding, hematoma formation, infection, muscle weakness, muscle pain, numbness, among others.        [place]    Nerve / Sites Muscle Latency Ref. Amplitude Ref. Rel Amp Segments Distance Velocity Ref. Area    ms ms mV mV %  cm m/s m/s mVms  L Median - APB     Wrist APB NR ?4.4 NR ?4.0 NR Wrist - APB 7   NR     Upper arm APB NR  NR  NR Upper arm - Wrist 22 NR ?49 NR  R Median - APB     Wrist APB 4.6 ?4.4 3.5 ?4.0 100 Wrist - APB 7   10.2     Upper arm APB 9.5  3.0  87.4 Upper arm - Wrist 24 49 ?49 10.0  L Ulnar - ADM     Wrist ADM 4.3 ?3.3 0.2 ?6.0 100 Wrist - ADM 7   0.3     B.Elbow ADM 8.6  0.2  102 B.Elbow - Wrist 22 51 ?49 0.3     A.Elbow ADM 10.6  0.2  103 A.Elbow - B.Elbow 10 51 ?49 0.3  R Ulnar - ADM     Wrist ADM 2.6 ?3.3 8.5 ?6.0 100 Wrist - ADM 7   23.0     B.Elbow ADM 6.3  6.7  78.4 B.Elbow - Wrist 21 57 ?49 18.7     A.Elbow ADM 7.9  6.3  94.7 A.Elbow - B.Elbow 10 61 ?49 18.4  L Peroneal - EDB     Ankle EDB 5.2 ?6.5 3.6 ?2.0 100 Ankle -  EDB 9   10.9     Fib head EDB 11.8  3.5  96.7 Fib head - Ankle 29 44 ?44 10.8     Pop fossa EDB 14.1  3.6  102 Pop fossa - Fib head 10 44 ?44 11.1         Pop fossa - Ankle      L Tibial - AH     Ankle AH 4.8 ?5.8 4.6 ?4.0 100 Ankle - AH 9   8.5     Pop fossa AH 13.9  2.4  51.8 Pop fossa - Ankle 38 41 ?41 6.4                  SNC    Nerve / Sites Rec. Site Peak Lat Ref.  Amp Ref. Segments Distance    ms ms V V  cm  L Sural - Ankle (Calf)     Calf Ankle 4.4 ?4.4 6 ?6 Calf - Ankle 14  L Superficial peroneal - Ankle     Lat leg Ankle 4.3 ?4.4 6 ?6 Lat leg - Ankle 14  L Median - Orthodromic (Dig II, Mid palm)     Dig II  Wrist 3.9 ?3.4 11 ?10 Dig II - Wrist 13  R Median - Orthodromic (Dig II, Mid palm)     Dig II Wrist 4.1 ?3.4 10 ?10 Dig II - Wrist 13  L Ulnar - Orthodromic, (Dig V, Mid palm)     Dig V Wrist 2.9 ?3.1 6 ?5 Dig V - Wrist 11  R Ulnar - Orthodromic, (Dig V, Mid palm)     Dig V Wrist 2.7 ?3.1 5 ?5 Dig V - Wrist 12                 F  Wave    Nerve F Lat Ref.   ms ms  L Tibial - AH 54.9 ?56.0  L Ulnar - ADM 31.9 ?32.0  R Ulnar - ADM 29.6 ?32.0           EMG Summary Table    Spontaneous MUAP Recruitment  Muscle IA Fib PSW Fasc Other Amp Dur. Poly Pattern  L. Pronator teres Increased 1+ None None _______ Increased Increased 1+ Reduced  L. Biceps brachii Increased None None None _______ Increased Increased 1+ Reduced  L. Deltoid Normal None None None _______ Increased Increased 1+ Reduced  L. Brachioradialis Normal None None None _______ Increased Increased 1+ Reduced  L. Extensor digitorum communis Normal None None None _______ Increased Increased 1+ Reduced  L. Cervical paraspinals Normal None None None _______ Normal Normal Normal Normal  R. First dorsal interosseous Increased None None None _______ Normal Normal Normal Reduced  R. Pronator teres Normal None None None _______ Normal Normal Normal Normal  R. Biceps brachii Normal None None None _______ Normal Normal Normal Normal  R. Deltoid Normal None None None _______ Normal Normal Normal Normal  R. Triceps brachii Normal None None None _______ Normal Normal Normal Normal  R. Brachioradialis Normal None None None _______ Normal Normal Normal Normal  R. Extensor digitorum communis Normal None None None _______ Normal Normal Normal Normal  R. Cervical paraspinals Normal None None None _______ Normal Normal Normal Normal  R. Tibialis anterior Increased None None None _______ Increased Increased Normal Reduced  R. Tibialis posterior Increased None None None _______ Increased Increased Normal Reduced  R. Peroneus longus Normal None None  None _______ Normal Normal Normal Reduced  R. Vastus lateralis Normal None None  None _______ Increased Increased 1+ Reduced  L. Tibialis anterior Normal None None None _______ Increased Increased 1+ Reduced  L. Tibialis posterior Normal None None None _______ Normal Normal Normal Reduced  L. Gastrocnemius (Medial head) Normal None None None _______ Increased Increased 1+ Reduced  L. Vastus lateralis Normal None None None _______ Increased Increased 1+ Reduced  R. Lumbar paraspinals (low) Normal None None None _______ Normal Normal Normal Normal  R. Lumbar paraspinals (mid) Normal None None None _______ Normal Normal Normal Normal  L. Lumbar paraspinals (low) Normal None None None _______ Normal Normal Normal Normal  L. Lumbar paraspinals (mid) Normal None None None _______ Normal Normal Normal Normal  L. Flexor digitorum profundus (Ulnar) Increased 1+ None None _______ Increased Increased 1+ Reduced

## 2019-12-31 NOTE — Progress Notes (Signed)
PATIENT: Tammy Mcknight DOB: 12-11-1955  HISTORICAL  Tammy Mcknight Tammy Mcknight is a 64 year old female, seen in request by her primary care Dr. Hillis Range evaluation of dizziness, unsteady gait,  I have reviewed and summarized the referring note from the referring physician.  Also revealed multiple [facility] chart, patient was able to provide limited detail in her previous injury,  She fell off her porch while feeding the bird in 2014, when she awakened she felt confused, but was able to reach out to her husband to call for help, was initially treated at [facility], later was transferred to [facility], per record, she suffered a ruptured spleen, head injury, she received massive blood transfusion, also reported acute renal failure, per patient, during hospital stay she also suffered cardiac arrest  She also had a history of right hip replacement in May 2019,  She used to work in home health aide, 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   I reviewed outside record, MRI of brain in June 2017, right maxillary sinus is opacified with chronic inflammatory changes, there also other evidence of chronic sinusitis, in left maxillary sinus.  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  There was no evidence of acute ischemic changes.   MRI of cervical spine showed degenerative changes, there was  no significant canal or foraminal narrowing  UPDATE December 02 2018 She is accompanied by her mother-in-law [name] at today's visit, who provides supplementary history,  Since falling off porch of 10 feet in 2014, patient was noted to have significant memory loss, gradually getting worse, she had abdominal surgery, also had few cardiac arrest need resuscitation during the incident, since then, she has episode of transient dizziness, mild confusion, lasting less than 1 minute, no loss of consciousness, no seizure-like activity noticed, she is having recurrect spells almost daily basis over the past few months,  She also has strong family history of dementia, both father and mother died of dementia in their 66s  She was noted to repeating herself, got lost while driving, and making comments that is not in line with reality,  We have personally reviewed MRI of the brain without contrast May 2020, mild changes of chronic microvascular ischemia, possible remote lacunar infarction in the right periventricular white matter, overall no change compared to previous MRI from September 2019   She was also noted to have significant atrophy, weakness of left hand, arm numbness involving left hand to left elbow level  UPDATE Mar 25 2019: She is accompanied by her mother-in-law at today's clinic visit, She continues to decline slowly, mild worsening gait abnormality, mental slowing, confusion, difficult to keep up with her medications,  Pulmonary functional test in August 2020 The FVC, FEV1, FEV1/FVC ratio and FEF25-75% are within normal limits. Lung volumes are within normal limits. Following administration of bronchodilators, there is no significant response. The reduced diffusing capacity indicates a minimal loss of functional alveolar capillary surface. However, the diffusing capacity  was not corrected for the patient's hemoglobin. 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 Anemia cannot be excluded as a potential cause of the diffusion defect without correcting the observed diffusing capacity for hemoglobin.  EMG nerve conduction study in August showed findings worrisome for motor neuron disease, there is also evidence of moderate right carpal tunnel syndrome.  Laboratory evaluations in June 2020 showed normal or negative RPR, E17, HIV, folic acid, C-reactive protein, TSH, CPK, ESR, CBC, CMP showed mild elevated glucose 123  I personally reviewed MRI of cervical spine in June 2020: Mild multilevel degenerative changes, there was no significant canal or foraminal stenosis  UPDATE November 24 2019: She is accompanied by her husband at today's clinical visit, there was no significant change, continue has gait abnormality, muscle atrophy, memory loss  Previous EMG nerve conduction study August 2020 showed evidence of chronic neuropathic changes involving bilateral cervical, lumbar sacral myotomes, we will repeat EMG nerve conduction study   UPDATE December 31 2019: Patient is accompanied by her husband for electrodiagnostic study today, which continues show evidence of significant chronic neuropathic changes involving left T1, C8, C7 myotomes then the more proximal left cervical myotomes.  There is also subtle evidence of chronic neuropathic changes involving bilateral lower extremities.  There was no significant change compared to previous study in August 2020.  Clinical wise, patient sent her husband continue complains of slow worsening, including gait abnormality, upper extremity muscle weakness, she denies significant sensory loss, she also had gradual worsening mental confusion,  REVIEW OF SYSTEMS: Full 14 system review of systems performed and notable only for as above All other review of systems were negative.  ALLERGIES: No Known Allergies  HOME MEDICATIONS: Current Outpatient Medications  Medication Sig Dispense Refill  . atorvastatin (LIPITOR) 40 MG  tablet Take 40 mg by mouth daily.    . Cholecalciferol (VITAMIN D3 PO) Take 1,000 Units by mouth daily.    . cinacalcet (SENSIPAR) 30 MG tablet Take 30 mg by mouth 3 (three) times a week.     . citalopram (CELEXA) 40 MG tablet Take 40 mg by mouth daily.    Marland Kitchen labetalol (NORMODYNE) 200 MG tablet Take 400 mg by mouth 2 (two) times daily.    Marland Kitchen lamoTRIgine (LAMICTAL) 100 MG tablet Take 1 tablet (100 mg total) by mouth 2 (two) times daily. 180 tablet 4  . memantine (NAMENDA) 10 MG tablet Take 1 tablet (10 mg total) by mouth 2 (two) times daily. 180 tablet 4  . valsartan (DIOVAN) 320 MG tablet TAKE 1 TABLET BY MOUTH IN THE MORNING FOR HIGH BLOOD PRESSURE     No current facility-administered medications for this visit.    PAST MEDICAL HISTORY: Past Medical History:  Diagnosis Date  . Acne rosacea   . Anoxic brain injury ([place])   . Arthritis   . Ataxia   . Bilateral carpal tunnel syndrome   . Breast lesion on mammography    Left  . Bulging lumbar disc   . Cardiac arrest due to trauma [facility])    From a fall off of her porch 2014  . Carpal tunnel syndrome of left wrist   . Cervical radiculopathy at C8   . 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 Depression   . 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 Dizziness due to old head injury   . Elevated liver enzymes   . Generalized osteoarthritis of multiple sites   .  GERD (gastroesophageal reflux disease)   . Herniated nucleus pulposus, C4-5 left   . Hypercalcemia   . Hypertension   . Hyperthyroidism   . 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 Left hand weakness   . Lumbar radiculopathy   . Memory loss   . Menopausal syndrome   . 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 Pharyngeal dysphagia   . RLS (restless legs syndrome)   . Thyroid nodule     PAST SURGICAL HISTORY: Past Surgical History:  Procedure Laterality Date  . ABDOMINAL HYSTERECTOMY    . BREAST LUMPECTOMY WITH RADIOACTIVE SEED LOCALIZATION Left 05/13/2018   Procedure: LEFT  BREAST LUMPECTOMY WITH RADIOACTIVE SEED LOCALIZATION;  Surgeon: Jovita Kussmaul, MD;  Location: [place];  Service: General;  Laterality: Left;  . BREAST SURGERY     right benign lump  . HIP ARTHROPLASTY     Right  . SPLENECTOMY, TOTAL    . TONSILLECTOMY    . TONSILLECTOMY AND ADENOIDECTOMY    . TUBAL LIGATION      FAMILY HISTORY: Family History  Problem Relation Age of Onset  . Heart failure Mother   . Dementia Mother   . Hypertension Mother   . Fibromyalgia Mother   . Migraines Mother   . Lupus Mother   . Dementia Father   . Hypertension Father   . Leukemia Father     SOCIAL HISTORY:   Social History   Socioeconomic History  . Marital status: Married    Spouse name: Not on file  . Number of children: 2  . Years of education: GED  . Highest education level: Not on file  Occupational History  . Occupation: Diabled  Tobacco Use  . Smoking status: Never Smoker  . Smokeless tobacco: Never Used  Vaping Use  . Vaping Use: Never used  Substance and Sexual Activity  . Alcohol use: Not Currently  . Drug use: Never  . Sexual activity: Not on file  Other Topics Concern  . Not on file  Social History Narrative   Lives at home with her husband.   Right-handed.   Occasional tea.   Social Determinants of Health   Financial Resource Strain:   . Difficulty of Paying Living Expenses:   Food Insecurity:   . Worried About Charity fundraiser in the Last Year:   . Arboriculturist in the Last Year:   Transportation Needs:   . Film/video editor (Medical):   Marland Kitchen Lack of Transportation (Non-Medical):   Physical Activity:   . Days of Exercise per Week:   . Minutes of Exercise per Session:   Stress:   . Feeling of Stress :   Social Connections:   . Frequency of Communication with Friends and Family:   . Frequency of Social Gatherings with Friends and Family:   . Attends Religious Services:   . Active Member of Clubs or Organizations:   . Attends Archivist  Meetings:   Marland Kitchen Marital Status:   Intimate Partner Violence:   . Fear of Current or Ex-Partner:   . Emotionally Abused:   Marland Kitchen Physically Abused:   . Sexually Abused:     PHYSICAL EXAMNIATION:  Gen: NAD, conversant, well nourised, obese, well groomed                      NEUROLOGICAL EXAM:  MMSE - Mini Mental State Exam 09/02/2019 03/25/2019  Orientation to time 4 5  Orientation to Place 4 5  Registration 3 3  Attention/ Calculation 0 5  Recall 2 2  Language- name 2 objects 2 2  Language- repeat 0 1  Language- follow 3 step command 3 3  Language- read & follow direction 1 1  Write a sentence 1 1  Copy design 0 0  Total score 20 28  animal naming 8   CRANIAL NERVES: CN II: Visual fields are full to confrontation.  Pupils are round equal and briskly reactive to light. CN III, IV, VI: extraocular movement are normal. No ptosis. CN V: Facial sensation is intact to pinprick in all 3 divisions bilaterally. Corneal responses are intact.  CN VII: Face is symmetric with normal eye closure and smile. CN VIII: Hearing is normal to rubbing fingers CN IX, X: Palate elevates symmetrically. Phonation is normal. CN XI: Head turning and shoulder shrug are intact CN XII: Tongue is midline with normal movements and no atrophy.  MOTOR: Significant left hand intrinsic muscle atrophy, bilateral shoulder abduction R/L 4+/4, elbow flexion5-/4+, elbow extension 4+/4, wrist flexion5/3, wrist extension 5/3, grip4/3, no significant bilateral proximal and distal muscle weakness  REFLEXES: Reflexes are 2+ and symmetric at the biceps, triceps, knees, and ankles. Plantar responses are extensor bilaterally  SENSORY: No significant sensory loss  COORDINATION: Rapid alternating movements and fine finger movements are intact. There is no dysmetria on finger-to-nose and heel-knee-shin.    GAIT/STANCE: She could not get up from seated position arms crossed, need to push on chair, wide-based  unsteady  Assessment plan: History of brain injury, cardiac arrest in 2014 Recurrent spells of transient loss of consciousness,  Possible partial seizure, improved with lamotrigine 100 mg twice a day  EEG in June 2020 showed no significant abnormality.  Gradual worsening memory loss  Most related to her anoxic brain injury, brain trauma from fall  Continue Namenda 10 mg twice a day  Left hand muscle atrophy, weakness  EMG nerve conduction study showed active neuropathic changes mainly involving left T1, C8, C7 myotomes, slight involvement of more proximal cervical myotomes, subtle chronic neuropathic changes involving bilateral lower extremity muscles, sparing sensory component.  MRI of cervical spine, laboratory evaluation showed no treatable etiology  Differentiation diagnosis include multifocal motor neuropathy, cannot rule out possibility of motor neuron disease.  Laboratory evaluations including anti-GM 1 antibody, lumbar puncture, if there is evidence of cytoalbumin dissociation, may consider IVIG treatment    Marcial Pacas, M.D. Ph.D.  [facility] [address] [place], [place] [address] Ph: [phone] Fax: [phone]  CC: Algis Greenhouse, MD

## 2020-01-06 LAB — COPPER, SERUM: Copper: 128 ug/dL (ref 80–158)

## 2020-01-06 LAB — IRON AND TIBC
Iron Saturation: 38 % (ref 15–55)
Iron: 126 ug/dL (ref 27–139)
Total Iron Binding Capacity: 328 ug/dL (ref 250–450)
UIBC: 202 ug/dL (ref 118–369)

## 2020-01-06 LAB — GM1 IGG AUTOANTIBODIES: GM1 IgG Autoantibodies: <20 % (ref 0–30)

## 2020-01-06 LAB — INTERPRETATION

## 2020-01-06 LAB — FERRITIN: Ferritin: 95 ng/mL (ref 15–150)

## 2020-01-06 LAB — ANA W/REFLEX IF POSITIVE: Anti Nuclear Antibody (ANA): NEGATIVE

## 2020-01-06 LAB — TSH: TSH: 2.83 u[IU]/mL (ref 0.450–4.500)

## 2020-01-06 LAB — CK: Total CK: 123 U/L (ref 32–182)

## 2020-01-07 ENCOUNTER — Telehealth: Payer: Self-pay | Admitting: *Deleted

## 2020-01-07 NOTE — Telephone Encounter (Signed)
-----   Message from Marcial Pacas, MD sent at 01/06/2020  5:46 PM EDT ----- Please call patient for normal laboratory result

## 2020-01-07 NOTE — Telephone Encounter (Signed)
Left patient a detailed message, with results, on voicemail.  Provided our number to call back with any questions.  

## 2020-01-09 ENCOUNTER — Ambulatory Visit
Admission: RE | Admit: 2020-01-09 | Discharge: 2020-01-09 | Disposition: A | Discharge status: Home / Self Care | Payer: Medicare HMO | Source: Ambulatory Visit | Attending: Neurology | Admitting: Neurology

## 2020-01-09 ENCOUNTER — Other Ambulatory Visit: Payer: Self-pay

## 2020-01-09 VITALS — BP 148/71 | HR 72

## 2020-01-09 DIAGNOSIS — R410 Disorientation, unspecified: Secondary | ICD-10-CM

## 2020-01-09 DIAGNOSIS — R42 Dizziness and giddiness: Secondary | ICD-10-CM

## 2020-01-09 DIAGNOSIS — R413 Other amnesia: Secondary | ICD-10-CM

## 2020-01-09 DIAGNOSIS — M6281 Muscle weakness (generalized): Secondary | ICD-10-CM

## 2020-01-09 DIAGNOSIS — M62532 Muscle wasting and atrophy, not elsewhere classified, left forearm: Secondary | ICD-10-CM

## 2020-01-09 DIAGNOSIS — R29898 Other symptoms and signs involving the musculoskeletal system: Secondary | ICD-10-CM

## 2020-01-09 NOTE — Progress Notes (Signed)
Blood drawn from pts RAC for LP lab work. Blood drawn by Livingston Diones. Site clean and dry without any complications noted. Pt tolerated well.

## 2020-01-09 NOTE — Discharge Instructions (Signed)

## 2020-01-12 ENCOUNTER — Telehealth: Payer: Self-pay | Admitting: Neurology

## 2020-01-12 NOTE — Telephone Encounter (Signed)
I spoke to the patient. She is agreeable to move forward with IVIG treatment. A signed physician order was provided to [facility].

## 2020-01-12 NOTE — Telephone Encounter (Signed)
Please call patient, spinal fluid testing showed no significant abnormalities   We will still try to contact her insurance for potential treatment for multifocal motor neuropathy  IVIG 2 g/kg divided into 4 days Followed by maintenance dose 1 g/kg every 4 weeks For total of 6 treatment

## 2020-02-02 NOTE — Telephone Encounter (Signed)
Pt left a VM asking for a call back to discuss IVIG. Pt has not received a call regarding this yet.

## 2020-02-02 NOTE — Telephone Encounter (Signed)
This message has been provided to [facility] in [facility] to call patient regarding her IVIG appointment.

## 2020-02-04 NOTE — Telephone Encounter (Signed)
I spoke to the patient who confirmed she is not able to proceed with IVIG treatment. She would like Dr. Krista Blue to determine the next step. Reports no change in symptoms.

## 2020-02-04 NOTE — Telephone Encounter (Addendum)
Per [facility], the patient is responsible for $600.42 per infusion until her OOP is met. 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 The patient informed them that she will be unable to afford IVIG at this time.  I have called the patient and left a message requesting a call back.

## 2020-02-09 LAB — CSF CELL COUNT WITH DIFFERENTIAL
RBC Count, CSF: 0 cells/uL
WBC, CSF: 2 cells/uL (ref 0–5)

## 2020-02-09 LAB — MULTIPLE SCLEROSIS PANEL 2
Albumin Serum: 4.8 g/dL — ABNORMAL HIGH (ref 3.2–4.6)
Albumin, CSF: 24.6 mg/dL (ref 8.0–42.0)
CNS-IgG Synthesis Rate: -0.2 mg/24 h (ref <=3.3)
IgG (Immunoglobin G), Serum: 408 mg/dL — ABNORMAL LOW (ref 600–1540)
IgG Total CSF: 1.2 mg/dL (ref 0.8–7.7)
IgG-Index: 0.57 (ref <0.66)
Myelin Basic Protein: <2 mcg/L (ref 2.0–4.0)

## 2020-02-09 LAB — FUNGUS CULTURE W SMEAR
CULTURE:: NO GROWTH
MICRO NUMBER:: 10770069
SMEAR:: NONE SEEN
SPECIMEN QUALITY:: ADEQUATE

## 2020-02-09 LAB — GRAM STAIN
MICRO NUMBER:: 10770068
SPECIMEN QUALITY:: ADEQUATE

## 2020-02-09 LAB — PROTEIN, CSF: Total Protein, CSF: 45 mg/dL (ref 15–60)

## 2020-02-09 LAB — GLUCOSE, CSF: Glucose, CSF: 74 mg/dL (ref 40–80)

## 2020-02-09 LAB — VDRL, CSF: VDRL Quant, CSF: NONREACTIVE

## 2020-02-09 MED ORDER — PREDNISONE 10 MG PO TABS
ORAL_TABLET | ORAL | 0 refills | Status: DC
Start: 2020-02-09 — End: 2020-03-02

## 2020-02-09 NOTE — Addendum Note (Signed)
Addended by: Noberto Retort C on: 02/09/2020 04:16 PM   Modules accepted: Orders

## 2020-02-09 NOTE — Telephone Encounter (Signed)
I spoke to the patient and she would like to proceed with the Prednisone treatment. She has been rescheduled w/ Dr. Krista Blue on 04/19/20. She will call us with any concerns prior to this date. Rx sent to pharmacy per instructions below.

## 2020-02-09 NOTE — Telephone Encounter (Signed)
Please let patients know, the other treatment option would be steroid, if she wish, I can proceed with prednisone 10 mg  4 tablets every morning for 4 weeks, 3 tablets every morning for 4 weeks 2 tablets every morning  Follow-up appointment with me in 2 months after starting steroid treatment

## 2020-03-01 ENCOUNTER — Other Ambulatory Visit: Payer: Self-pay | Admitting: Neurology

## 2020-03-02 ENCOUNTER — Telehealth: Payer: Self-pay | Admitting: Neurology

## 2020-03-02 NOTE — Telephone Encounter (Signed)
     predniSONE (DELTASONE) 10 MG tablet  0 ordered       Summary: TAKE 4 TABLETS BY MOUTH IN THE MORNING FOR 7 DAYS, THEN 3 TABLETS IN THE MORNING FOR 7 DAYS, THEN 2 TABLETS IN THE MORNING THERAFTER, Normal  Start: 03/02/2020 Ord/Sold: 03/02/2020 (O) Report Taking:  Long-term:  Pharmacy: [facility] [place] Dose History ChangeDiscontinue     Patient Sig: TAKE 4 TABLETS BY MOUTH IN THE MORNING FOR 7 DAYS, THEN 3 TABLETS IN THE MORNING FOR 7 DAYS, THEN 2 TABLETS IN THE MORNING THERAFTER     Ordered on: 03/02/2020     Authorized by: Marcial Pacas     Dispense: 77 tablet     Admin Instructions: TAKE 4 TABLETS BY MOUTH IN THE MORNING FOR 7 DAYS, THEN 3 TABLETS IN THE MORNING FOR 7 DAYS, THEN 2 TABLETS IN THE MORNING THERAFTER     Has follow up on Apr 19 2020

## 2020-03-12 DIAGNOSIS — I499 Cardiac arrhythmia, unspecified: Secondary | ICD-10-CM | POA: Insufficient documentation

## 2020-04-17 ENCOUNTER — Other Ambulatory Visit: Payer: Self-pay | Admitting: Neurology

## 2020-04-19 ENCOUNTER — Ambulatory Visit: Payer: Medicare HMO | Admitting: Neurology

## 2020-04-19 ENCOUNTER — Other Ambulatory Visit: Payer: Self-pay

## 2020-04-19 ENCOUNTER — Encounter: Payer: Self-pay | Admitting: Neurology

## 2020-04-19 VITALS — BP 132/72 | HR 75 | Ht 67.0 in | Wt 159.0 lb

## 2020-04-19 DIAGNOSIS — M62532 Muscle wasting and atrophy, not elsewhere classified, left forearm: Secondary | ICD-10-CM

## 2020-04-19 DIAGNOSIS — R29898 Other symptoms and signs involving the musculoskeletal system: Secondary | ICD-10-CM

## 2020-04-19 DIAGNOSIS — R413 Other amnesia: Secondary | ICD-10-CM | POA: Diagnosis not present

## 2020-04-19 MED ORDER — LAMOTRIGINE 100 MG PO TABS: ORAL_TABLET | ORAL | 4 refills | Status: DC

## 2020-04-19 NOTE — Patient Instructions (Signed)
Prednisone 10mg  one tablet daily xone week, then stop.  Cardiac monitoring.

## 2020-04-19 NOTE — Progress Notes (Signed)
PATIENT: Tammy Mcknight DOB: 12-11-1955  HISTORICAL  Tammy Mcknight Tammy Mcknight is a 64 year old female, seen in request by her primary care Dr. Hillis Range evaluation of dizziness, unsteady gait,  I have reviewed and summarized the referring note from the referring physician.  Also revealed multiple [facility] chart, patient was able to provide limited detail in her previous injury,  She fell off her porch while feeding the bird in 2014, when she awakened she felt confused, but was able to reach out to her husband to call for help, was initially treated at [facility], later was transferred to [facility], per record, she suffered a ruptured spleen, head injury, she received massive blood transfusion, also reported acute renal failure, per patient, during hospital stay she also suffered cardiac arrest  She also had a history of right hip replacement in May 2019,  She used to work in home health aide, 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   I reviewed outside record, MRI of brain in June 2017, right maxillary sinus is opacified with chronic inflammatory changes, there also other evidence of chronic sinusitis, in left maxillary sinus.  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  There was no evidence of acute ischemic changes.   MRI of cervical spine showed degenerative changes, there was  no significant canal or foraminal narrowing  UPDATE December 02 2018 She is accompanied by her mother-in-law [name] at today's visit, who provides supplementary history,  Since falling off porch of 10 feet in 2014, patient was noted to have significant memory loss, gradually getting worse, she had abdominal surgery, also had few cardiac arrest need resuscitation during the incident, since then, she has episode of transient dizziness, mild confusion, lasting less than 1 minute, no loss of consciousness, no seizure-like activity noticed, she is having recurrect spells almost daily basis over the past few months,  She also has strong family history of dementia, both father and mother died of dementia in their 71s  She was noted to repeating herself, got lost while driving, and making comments that is not in line with reality,  We have personally reviewed MRI of the brain without contrast May 2020, mild changes of chronic microvascular ischemia, possible remote lacunar infarction in the right periventricular white matter, overall no change compared to previous MRI from September 2019   She was also noted to have significant atrophy, weakness of left hand, arm numbness involving left hand to left elbow level  UPDATE Mar 25 2019: She is accompanied by her mother-in-law at today's clinic visit, She continues to decline slowly, mild worsening gait abnormality, mental slowing, confusion, difficult to keep up with her medications,  Pulmonary functional test in August 2020 The FVC, FEV1, FEV1/FVC ratio and FEF25-75% are within normal limits. Lung volumes are within normal limits. Following administration of bronchodilators, there is no significant response. The reduced diffusing capacity indicates a minimal loss of functional alveolar capillary surface. However, the diffusing capacity  was not corrected for the patient's hemoglobin. 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 Anemia cannot be excluded as a potential cause of the diffusion defect without correcting the observed diffusing capacity for hemoglobin.  EMG nerve conduction study in August showed findings worrisome for motor neuron disease, there is also evidence of moderate right carpal tunnel syndrome.  Laboratory evaluations in June 2020 showed normal or negative RPR, N27, HIV, folic acid, C-reactive protein, TSH, CPK, ESR, CBC, CMP showed mild elevated glucose 123  I personally reviewed MRI of cervical spine in June 2020: Mild multilevel degenerative changes, there was no significant canal or foraminal stenosis  UPDATE November 24 2019: She is accompanied by her husband at today's clinical visit, there was no significant change, continue has gait abnormality, muscle atrophy, memory loss  Previous EMG nerve conduction study August 2020 showed evidence of chronic neuropathic changes involving bilateral cervical, lumbar sacral myotomes, we will repeat EMG nerve conduction study   UPDATE December 31 2019: Patient is accompanied by her husband for electrodiagnostic study today, which continues show evidence of significant chronic neuropathic changes involving left T1, C8, C7 myotomes then the more proximal left cervical myotomes.  There is also subtle evidence of chronic neuropathic changes involving bilateral lower extremities.  There was no significant change compared to previous study in August 2020.  Clinical wise, patient sent her husband continue complains of slow worsening, including gait abnormality, upper extremity muscle weakness, she denies significant sensory loss, she also had gradual worsening mental confusion,  UPDATE Apr 19 2020: She is accompanied today's clinical visit, she was started on prednisone since August 2021, starting from 40 mg daily, tapering down to 20 mg daily, complains of worsening diabetes, no significant improvement in his muscle weakness,  She continue complains of almost  daily function spells, feeling weak, has to be helped her husband to get up from low position, go to bed, no loss of consciousness  She also reported normal cardiac monitoring about 2 months ago, that was ordered by her primary care physician. There was no event during 1 week cardiac monitoring  She reported significant improvement for her similar spells in the past when she was started on lamotrigine 100 mg twice a day, tolerating medication well  Reviewed extensive laboratory evaluations in 2021, negative GM 1 antibody, normal ferritin 95, copper, ANA, CPK, TSH,  Spinal fluid testing July 2021, normal total protein 45, VDRL, negative oligoclonal banding, WBC of 2   REVIEW OF SYSTEMS: Full 14 system review of systems performed and notable only for as above All other review of systems were negative.  ALLERGIES: No Known Allergies  HOME MEDICATIONS: Current Outpatient Medications  Medication Sig Dispense Refill  . atorvastatin (LIPITOR) 40 MG tablet Take 40 mg by mouth daily.    . Cholecalciferol (VITAMIN D3 PO) Take 1,000 Units by mouth daily.    . cinacalcet (SENSIPAR) 30 MG tablet Take 30 mg by mouth 3 (three) times a week.     . citalopram (CELEXA) 40 MG tablet Take 40 mg by mouth daily.    Marland Kitchen labetalol (NORMODYNE) 200 MG tablet Take 400 mg by mouth 2 (two) times daily.    Marland Kitchen lamoTRIgine (LAMICTAL) 100 MG tablet Take 1 tablet (100 mg total) by mouth 2 (two) times daily. 180 tablet 4  . memantine (NAMENDA) 10 MG tablet Take 1 tablet (10 mg total) by mouth 2 (two) times daily. 180 tablet 4  . predniSONE (DELTASONE) 10 MG tablet TAKE  4 TABLETS BY MOUTH IN THE MORNING FOR 7 DAYS, THEN 3 TABLETS IN THE MORNING FOR 7 DAYS, THEN 2 TABLETS IN THE MORNING THERAFTER 77 tablet 0  . valsartan (DIOVAN) 320 MG tablet TAKE 1 TABLET BY MOUTH IN THE MORNING FOR HIGH BLOOD PRESSURE     No current facility-administered medications for this visit.    PAST MEDICAL HISTORY: Past Medical History:   Diagnosis Date  . Acne rosacea   . Anoxic brain injury ([place])   . Arthritis   . Ataxia   . Bilateral carpal tunnel syndrome   . Breast lesion on mammography    Left  . Bulging lumbar disc   . Cardiac arrest due to trauma [facility])    From a fall off of her porch 2014  . Carpal tunnel syndrome of left wrist   . Cervical radiculopathy at C8   . 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 Depression   . 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 Dizziness due to old head injury   . Elevated liver enzymes   . Generalized osteoarthritis of multiple sites   . GERD (gastroesophageal reflux disease)   . Herniated nucleus pulposus, C4-5 left   . Hypercalcemia   . Hypertension   . Hyperthyroidism   . 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 Left hand weakness   . Lumbar radiculopathy   . Memory loss   . Menopausal syndrome   . 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 Pharyngeal dysphagia   . RLS (restless legs syndrome)   . Thyroid nodule     PAST SURGICAL HISTORY: Past Surgical History:  Procedure Laterality Date  . ABDOMINAL HYSTERECTOMY    . BREAST LUMPECTOMY WITH RADIOACTIVE SEED LOCALIZATION Left 05/13/2018   Procedure: LEFT BREAST LUMPECTOMY WITH RADIOACTIVE SEED LOCALIZATION;  Surgeon: Jovita Kussmaul, MD;  Location: [facility];  Service: General;  Laterality: Left;  . BREAST SURGERY     right benign lump  . HIP ARTHROPLASTY     Right  . SPLENECTOMY, TOTAL    . TONSILLECTOMY    . TONSILLECTOMY AND ADENOIDECTOMY    . TUBAL LIGATION      FAMILY HISTORY: Family History  Problem Relation Age of Onset  . Heart failure Mother   . Dementia Mother   . Hypertension Mother   . Fibromyalgia Mother   . Migraines Mother   . Lupus Mother   . Dementia Father   . Hypertension Father   . Leukemia Father     SOCIAL HISTORY:   Social History   Socioeconomic History  . Marital status: Married    Spouse name: Not on file  . Number of children: 2  . Years of education: GED  .  Highest education level: Not on file  Occupational History  . Occupation: Diabled  Tobacco Use  . Smoking status: Never Smoker  . Smokeless tobacco: Never Used  Vaping Use  . Vaping Use: Never used  Substance and Sexual Activity  . Alcohol use: Not Currently  . Drug use: Never  . Sexual activity: Not on file  Other Topics Concern  . Not on file  Social History Narrative   Lives at home with her husband.   Right-handed.   Occasional tea.   Social Determinants of Health   Financial Resource Strain:   . Difficulty of Paying Living Expenses: Not on file  Food Insecurity:   . Worried About Charity fundraiser in the Last Year:  Not on file  . Ran Out of Food in the Last Year: Not on file  Transportation Needs:   . Lack of Transportation (Medical): Not on file  . Lack of Transportation (Non-Medical): Not on file  Physical Activity:   . Days of Exercise per Week: Not on file  . Minutes of Exercise per Session: Not on file  Stress:   . Feeling of Stress : Not on file  Social Connections:   . Frequency of Communication with Friends and Family: Not on file  . Frequency of Social Gatherings with Friends and Family: Not on file  . Attends Religious Services: Not on file  . Active Member of Clubs or Organizations: Not on file  . Attends Archivist Meetings: Not on file  . Marital Status: Not on file  Intimate Partner Violence:   . Fear of Current or Ex-Partner: Not on file  . Emotionally Abused: Not on file  . Physically Abused: Not on file  . Sexually Abused: Not on file    PHYSICAL EXAMNIATION:  Gen: NAD, conversant, well nourised, obese, well groomed                      NEUROLOGICAL EXAM:  MMSE - Mini Mental State Exam 09/02/2019 03/25/2019  Orientation to time 4 5  Orientation to Place 4 5  Registration 3 3  Attention/ Calculation 0 5  Recall 2 2  Language- name 2 objects 2 2  Language- repeat 0 1  Language- follow 3 step command 3 3  Language- read &  follow direction 1 1  Write a sentence 1 1  Copy design 0 0  Total score 20 28  animal naming 8   CRANIAL NERVES: CN II: Visual fields are full to confrontation.  Pupils are round equal and briskly reactive to light. CN III, IV, VI: extraocular movement are normal. No ptosis. CN V: Facial sensation is intact to pinprick in all 3 divisions bilaterally. Corneal responses are intact.  CN VII: Face is symmetric with normal eye closure and smile. CN VIII: Hearing is normal to rubbing fingers CN IX, X: Palate elevates symmetrically. Phonation is normal. CN XI: Head turning and shoulder shrug are intact   MOTOR: Significant left hand intrinsic muscle atrophy, bilateral shoulder abduction R/L 4+/4, elbow flexion5-/4+, elbow extension 4+/4, wrist flexion5/3, wrist extension 5/3, grip4/3, no significant bilateral proximal and distal muscle weakness  REFLEXES: Reflexes are 2+ and symmetric at the biceps, triceps, knees, and ankles. Plantar responses are extensor bilaterally  SENSORY: No significant sensory loss  COORDINATION: Rapid alternating movements and fine finger movements are intact. There is no dysmetria on finger-to-nose and heel-knee-shin.    GAIT/STANCE: She could not get up from seated position arms crossed, need to push on chair, wide-based mildly unsteady  Assessment plan: History of brain injury, cardiac arrest in 2014 Recurrent spells of transient loss of consciousness,  Possible partial seizure, improved with lamotrigine 100 mg twice a day  EEG in June 2020 showed no significant abnormality.  No complaints of recurrent spells, check lamotrigine level, repeat EEG, increase lamotrigine to 100/200 mg  Reported normal cardiac monitoring for 1 week in September 2021 that was arranged by her primary care, (I do not have the formal report)  Gradual worsening memory loss  Most related to her anoxic brain injury, brain trauma from fall  Continue Namenda 10 mg twice a day  Left  hand muscle atrophy, weakness  EMG nerve conduction study showed active  neuropathic changes mainly involving left T1, C8, C7 myotomes, slight involvement of more proximal cervical myotomes, subtle chronic neuropathic changes involving bilateral lower extremity muscles, sparing sensory component.  MRI of cervical spine, laboratory evaluation showed no treatable etiology  Differentiation diagnosis include multifocal motor neuropathy, cannot rule out possibility of motor neuron disease.  Anti-GM 1 antibody was negative  Spinal fluid testing in July 2021 showed normal total protein, 45, negative oligoclonal banding  She cannot afford the high co-pay for IVIG,  Had a trial of prednisone in August 2021, up to 40 mg daily, slow tapering, reported no significant improvement, complains of worsening diabetes, will taper off  Laboratory evaluations including A1c,   Marcial Pacas, M.D. Ph.D.  [facility] [address] [place], [place] [address] Ph: [phone] Fax: [phone]  CC: Algis Greenhouse, MD

## 2020-04-21 ENCOUNTER — Telehealth: Payer: Self-pay | Admitting: Neurology

## 2020-04-21 LAB — HEMOGLOBIN A1C
Est. average glucose Bld gHb Est-mCnc: 169 mg/dL
Hgb A1c MFr Bld: 7.5 % — ABNORMAL HIGH (ref 4.8–5.6)

## 2020-04-21 LAB — LAMOTRIGINE LEVEL: Lamotrigine Lvl: 5.5 ug/mL (ref 2.0–20.0)

## 2020-04-21 NOTE — Addendum Note (Signed)
Addended by: Noberto Retort C on: 04/21/2020 12:06 PM   Modules accepted: Orders

## 2020-04-21 NOTE — Telephone Encounter (Signed)
Please call patient A1C 7.5, suboptimal control of DM, please make sure that she will taper off prednisone, also follow up with her PCP for better control of DM.  Lamotrigine level 5.5, was within normal limit

## 2020-04-21 NOTE — Telephone Encounter (Signed)
I spoke to the patient and provided her with the lab results. She verbalized understanding. As of the week, she is no longer taking Prednisone. She will contact her PCP to schedule a follow up on her elevated A1C.

## 2020-04-28 ENCOUNTER — Ambulatory Visit: Payer: Medicare HMO | Admitting: Neurology

## 2020-04-28 ENCOUNTER — Other Ambulatory Visit: Payer: Self-pay

## 2020-04-28 DIAGNOSIS — R29898 Other symptoms and signs involving the musculoskeletal system: Secondary | ICD-10-CM

## 2020-04-28 DIAGNOSIS — R413 Other amnesia: Secondary | ICD-10-CM

## 2020-04-28 DIAGNOSIS — R410 Disorientation, unspecified: Secondary | ICD-10-CM | POA: Diagnosis not present

## 2020-04-28 DIAGNOSIS — M62532 Muscle wasting and atrophy, not elsewhere classified, left forearm: Secondary | ICD-10-CM

## 2020-04-29 DIAGNOSIS — M545 Low back pain, unspecified: Secondary | ICD-10-CM | POA: Insufficient documentation

## 2020-05-03 DIAGNOSIS — M25559 Pain in unspecified hip: Secondary | ICD-10-CM | POA: Insufficient documentation

## 2020-05-05 NOTE — Procedures (Signed)
   HISTORY: 64 year old female presented with dizziness, unsteady gait, transient episode of loss of consciousness  TECHNIQUE:  This is a routine 16 channel EEG recording with one channel devoted to a limited EKG recording.  It was performed during wakefulness, drowsiness and asleep.  Hyperventilation and photic stimulation were performed as activating procedures.  There are minimum muscle and movement artifact noted.  Upon maximum arousal, posterior dominant waking rhythm consistent of mildly dysrhythmic alpha range activity, with frequency of 10 hz. Activities are symmetric over the bilateral posterior derivations and attenuated with eye opening.  Hyperventilation produced mild/moderate buildup with higher amplitude and the slower activities noted.  Photic stimulation did not alter the tracing.  During EEG recording, patient developed drowsiness and no deeper stage of sleep was achieved  During EEG recording, there was no epileptiform discharge noted.  EKG demonstrate sinus rhythm, with heart rate of 72 bpm  CONCLUSION: This is a  normal awake EEG.  There is no electrodiagnostic evidence of epileptiform discharge.  Marcial Pacas, M.D. Ph.D.  [facility] [place], [place] [address] Phone: [phone] Fax:      [phone]

## 2020-05-10 ENCOUNTER — Telehealth: Payer: Self-pay | Admitting: Neurology

## 2020-05-10 NOTE — Telephone Encounter (Signed)
The patient was calling for the results of her EEG completed on 04/28/20.   CONCLUSION: This is a  normal awake EEG.  There is no electrodiagnostic evidence of epileptiform discharge.  Marcial Pacas, M.D. Ph.D.  She is aware of the findings. She will continue her current medications and keep her pending follow up. She verbalized understanding of this information.

## 2020-05-10 NOTE — Telephone Encounter (Signed)
Pt. was calling to see if we have received her test results. Please advise.

## 2020-06-03 ENCOUNTER — Ambulatory Visit: Payer: Medicare HMO | Admitting: Neurology

## 2020-07-29 DIAGNOSIS — R5383 Other fatigue: Secondary | ICD-10-CM | POA: Insufficient documentation

## 2020-08-26 DIAGNOSIS — K2960 Other gastritis without bleeding: Secondary | ICD-10-CM | POA: Insufficient documentation

## 2020-10-19 ENCOUNTER — Encounter: Payer: Self-pay | Admitting: Neurology

## 2020-10-19 ENCOUNTER — Ambulatory Visit: Payer: Medicare HMO | Admitting: Neurology

## 2020-10-19 VITALS — BP 165/90 | HR 73 | Ht 66.5 in | Wt 148.0 lb

## 2020-10-19 DIAGNOSIS — R29898 Other symptoms and signs involving the musculoskeletal system: Secondary | ICD-10-CM | POA: Diagnosis not present

## 2020-10-19 DIAGNOSIS — R413 Other amnesia: Secondary | ICD-10-CM | POA: Diagnosis not present

## 2020-10-19 DIAGNOSIS — M6281 Muscle weakness (generalized): Secondary | ICD-10-CM

## 2020-10-19 MED ORDER — MEMANTINE HCL 10 MG PO TABS: 10.0000 mg | ORAL_TABLET | Freq: Two times a day (BID) | ORAL | 4 refills | Status: DC

## 2020-10-19 NOTE — Progress Notes (Signed)
PATIENT: Tammy Mcknight DOB: 07-30-1955  HISTORICAL  Roshini Fulwider Wateen Varon is a 65 year old female, seen in request by her primary care Dr. Hillis Range evaluation of dizziness, unsteady gait,  I have reviewed and summarized the referring note from the referring physician.  Also revealed multiple [facility] chart, patient was able to provide limited detail in her previous injury,  She fell off her porch while feeding the bird in 2014, when she awakened she felt confused, but was able to reach out to her husband to call for help, was initially treated at [facility], later was transferred to [facility], per record, she suffered a ruptured spleen, head injury, she received massive blood transfusion, also reported acute renal failure, per patient, during hospital stay she also suffered cardiac arrest  She also had a history of right hip replacement in May 2019,  She used to work in home health aide, 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   I reviewed outside record, MRI of brain in June 2017, right maxillary sinus is opacified with chronic inflammatory changes, there also other evidence of chronic sinusitis, in left maxillary sinus.  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  There was no evidence of acute ischemic changes.   MRI of cervical spine showed degenerative changes, there was  no significant canal or foraminal narrowing  UPDATE December 02 2018 She is accompanied by her mother-in-law [name] at today's visit, who provides supplementary history,  Since falling off porch of 10 feet in 2014, patient was noted to have significant memory loss, gradually getting worse, she had abdominal surgery, also had few cardiac arrest need resuscitation during the incident, since then, she has episode of transient dizziness, mild confusion, lasting less than 1 minute, no loss of consciousness, no seizure-like activity noticed, she is having recurrect spells almost daily basis over the past few months,  She also has strong family history of dementia, both father and mother died of dementia in their 19s  She was noted to repeating herself, got lost while driving, and making comments that is not in line with reality,  We have personally reviewed MRI of the brain without contrast May 2020, mild changes of chronic microvascular ischemia, possible remote lacunar infarction in the right periventricular white matter, overall no change compared to previous MRI from September 2019   She was also noted to have significant atrophy, weakness of left hand, arm numbness involving left hand to left elbow level  UPDATE Mar 25 2019: She is accompanied by her mother-in-law at today's clinic visit, She continues to decline slowly, mild worsening gait abnormality, mental slowing, confusion, difficult to keep up with her medications,  Pulmonary functional test in August 2020 The FVC, FEV1, FEV1/FVC ratio and FEF25-75% are within normal limits. Lung volumes are within normal limits. Following administration of bronchodilators, there is no significant response. The reduced diffusing capacity indicates a minimal loss of functional alveolar capillary surface. However, the diffusing capacity  was not corrected for the patient's hemoglobin. 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 Anemia cannot be excluded as a potential cause of the diffusion defect without correcting the observed diffusing capacity for hemoglobin.  EMG nerve conduction study in August showed findings worrisome for motor neuron disease, there is also evidence of moderate right carpal tunnel syndrome.  Laboratory evaluations in June 2020 showed normal or negative RPR, H88, HIV, folic acid, C-reactive protein, TSH, CPK, ESR, CBC, CMP showed mild elevated glucose 123  I personally reviewed MRI of cervical spine in June 2020: Mild multilevel degenerative changes, there was no significant canal or foraminal stenosis  UPDATE November 24 2019: She is accompanied by her husband at today's clinical visit, there was no significant change, continue has gait abnormality, muscle atrophy, memory loss  Previous EMG nerve conduction study August 2020 showed evidence of chronic neuropathic changes involving bilateral cervical, lumbar sacral myotomes, we will repeat EMG nerve conduction study   UPDATE December 31 2019: Patient is accompanied by her husband for electrodiagnostic study today, which continues show evidence of significant chronic neuropathic changes involving left T1, C8, C7 myotomes then the more proximal left cervical myotomes.  There is also subtle evidence of chronic neuropathic changes involving bilateral lower extremities.  There was no significant change compared to previous study in August 2020.  Clinical wise, patient sent her husband continue complains of slow worsening, including gait abnormality, upper extremity muscle weakness, she denies significant sensory loss, she also had gradual worsening mental confusion,  UPDATE Apr 19 2020: She is accompanied today's clinical visit, she was started on prednisone since August 2021, starting from 40 mg daily, tapering down to 20 mg daily, complains of worsening diabetes, no significant improvement in his muscle weakness,  She continue complains of almost  daily function spells, feeling weak, has to be helped her husband to get up from low position, go to bed, no loss of consciousness  She also reported normal cardiac monitoring about 2 months ago, that was ordered by her primary care physician. There was no event during 1 week cardiac monitoring  She reported significant improvement for her similar spells in the past when she was started on lamotrigine 100 mg twice a day, tolerating medication well  Reviewed extensive laboratory evaluations in 2021, negative GM 1 antibody, normal ferritin 95, copper, ANA, CPK, TSH,  Spinal fluid testing July 2021, normal total protein 45, VDRL, negative oligoclonal banding, WBC of 2  Update Oct 19, 2020 SS: At last visit in November 2021, A1c was elevated at 7.5, she was tapered off prednisone, Lamictal level was 5.5.  EEG was normal. Lamictal was increased to 100/200 mg daily last visit. Initially saw some improvement in spells, but then lost benefit.   April 2020 A1C was 5.8, CMP was unremarkable.   On Namenda, continues to feel forgetful, went to [facility], forgot she had already picked up her medication, has phone in hand, asking where it is.  Driving short distances.  Feels muscles are getting weaker over time, weakness spells come and go. Trouble getting out of chair, have to crawl. Gets dizzy if he stands too fast, or bends head over. Cooking breakfast, husband tells me of spell, he had to get her a chair.  Hands are getting weaker, worse on the left.  Afraid to be alone in the shower, afraid she might fall. Has frequent fall, when legs give out.   Seeing GI, having nausea, dysphagia, planning to have esophagus  stretched. Gets short of breath. Lost 6 lbs in last year, not much appetite due to nausea, dry heaves.  Here with her mother-in-law and husband.  REVIEW OF SYSTEMS: Full 14 system review of systems performed and notable only for as above  See HPI  ALLERGIES: No Known Allergies  HOME  MEDICATIONS: Current Outpatient Medications  Medication Sig Dispense Refill  . atorvastatin (LIPITOR) 40 MG tablet Take 40 mg by mouth daily.    . Cholecalciferol (VITAMIN D3 PO) Take 1,000 Units by mouth daily.    . cinacalcet (SENSIPAR) 30 MG tablet Take 30 mg by mouth 3 (three) times a week.     . citalopram (CELEXA) 40 MG tablet Take 40 mg by mouth daily.    Marland Kitchen labetalol (NORMODYNE) 200 MG tablet Take 400 mg by mouth 2 (two) times daily.    Marland Kitchen lamoTRIgine (LAMICTAL) 100 MG tablet One in the am, 2 tablets at night 270 tablet 4  . memantine (NAMENDA) 10 MG tablet Take 1 tablet (10 mg total) by mouth 2 (two) times daily. 180 tablet 4  . valsartan (DIOVAN) 320 MG tablet TAKE 1 TABLET BY MOUTH IN THE MORNING FOR HIGH BLOOD PRESSURE     No current facility-administered medications for this visit.    PAST MEDICAL HISTORY: Past Medical History:  Diagnosis Date  . Acne rosacea   . Anoxic brain injury ([place])   . Arthritis   . Ataxia   . Bilateral carpal tunnel syndrome   . Breast lesion on mammography    Left  . Bulging lumbar disc   . Cardiac arrest due to trauma [facility])    From a fall off of her porch 2014  . Carpal tunnel syndrome of left wrist   . Cervical radiculopathy at C8   . 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 Depression   . 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 Dizziness due to old head injury   . Elevated liver enzymes   . Generalized osteoarthritis of multiple sites   . GERD (gastroesophageal reflux disease)   . Herniated nucleus pulposus, C4-5 left   . Hypercalcemia   . Hypertension   . Hyperthyroidism   . 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 Left hand weakness   . Lumbar radiculopathy   . Memory loss   . Menopausal syndrome   . 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 Pharyngeal dysphagia   . RLS (restless legs syndrome)   . Thyroid nodule     PAST SURGICAL HISTORY: Past Surgical History:  Procedure Laterality Date  . ABDOMINAL HYSTERECTOMY    .  BREAST LUMPECTOMY WITH RADIOACTIVE SEED LOCALIZATION Left 05/13/2018   Procedure: LEFT BREAST LUMPECTOMY WITH RADIOACTIVE SEED LOCALIZATION;  Surgeon: Jovita Kussmaul, MD;  Location: [facility];  Service: General;  Laterality: Left;  . BREAST SURGERY     right benign lump  . HIP ARTHROPLASTY     Right  . SPLENECTOMY, TOTAL    . TONSILLECTOMY    . TONSILLECTOMY AND ADENOIDECTOMY    . TUBAL LIGATION      FAMILY HISTORY: Family History  Problem Relation Age of Onset  . Heart failure Mother   . Dementia Mother   . Hypertension Mother   . Fibromyalgia Mother   . Migraines Mother   . Lupus Mother   . Dementia Father   . Hypertension Father   . Leukemia Father     SOCIAL HISTORY:   Social History  Socioeconomic History  . Marital status: Married    Spouse name: Not on file  . Number of children: 2  . Years of education: GED  . Highest education level: Not on file  Occupational History  . Occupation: Diabled  Tobacco Use  . Smoking status: Never Smoker  . Smokeless tobacco: Never Used  Vaping Use  . Vaping Use: Never used  Substance and Sexual Activity  . Alcohol use: Not Currently  . Drug use: Never  . Sexual activity: Not on file  Other Topics Concern  . Not on file  Social History Narrative   Lives at home with her husband.   Right-handed.   Occasional tea.   Social Determinants of Health   Financial Resource Strain: Not on file  Food Insecurity: Not on file  Transportation Needs: Not on file  Physical Activity: Not on file  Stress: Not on file  Social Connections: Not on file  Intimate Partner Violence: Not on file    PHYSICAL EXAMNIATION:  Gen: NAD, conversant, well nourised,well groomed, thoughts are scattered                  NEUROLOGICAL EXAM:  MMSE - [place] Exam 10/19/2020 09/02/2019 03/25/2019  Orientation to time _0 Orientation to Place _1 Registration _2 Attention/ Calculation 5 0 5  Recall _3 Language- name 2 objects  _4 Language- repeat 0 0 1  Language- follow 3 step command _5 Language- read & follow direction _6 Write a sentence _7 Copy design 0 0 0  Total score _8 animal naming 8   CRANIAL NERVES: CN II: Visual fields are full to confrontation.  Pupils are round equal and briskly reactive to light. CN III, IV, VI: extraocular movement are normal. No ptosis. CN V: Facial sensation is intact to pinprick in all 3 divisions bilaterally. Corneal responses are intact.  CN VII: Face is symmetric with normal eye closure and smile. CN XI: Head turning and shoulder shrug are intact  MOTOR: Significant left hand intrinsic muscle atrophy, weak left hand grip, finger abduction weakness; no other weakness of the left upper extremity compared to the right.  Mild left hip flexion weakness  REFLEXES: Reflexes are 2+, but 3+ at the knees   SENSORY: Intact to soft touch to all extremities  COORDINATION: Finger-nose-finger and heel-to-shin is normal bilaterally  GAIT/STANCE: Gait is slightly wide-based, cautious, but steady  Assessment plan:  1. History of brain injury, cardiac arrest in 2014 2. Recurrent spells of transient loss of consciousness  -Possible partial seizure, improved with lamotrigine 100 mg twice a day -EEG normal in November 2021; EEG in June 2020 showed no significant abnormality -Lamictal level was 5.5 in November 2021, Lamictal increased 100/200 mg daily, initial improvement in weakness spells, but benefit wore off -Reported normal cardiac monitoring for 1 week in September 2021 that was arranged by her primary care -For now keep Lamictal at current dosing  3. Gradual worsening memory loss -Most related to her anoxic brain injury, brain trauma from fall -Continue Namenda 10 mg twice a day -MMSE 25/30  4. Left hand muscle atrophy, weakness -EMG nerve conduction study showed active neuropathic changes mainly involving left T1, C8, C7 myotomes, slight  involvement of more proximal cervical myotomes, subtle chronic neuropathic changes involving bilateral lower extremity muscles, sparing sensory component. -MRI of cervical spine,  laboratory evaluation showed no treatable etiology -Differentiation diagnosis include multifocal motor neuropathy, cannot rule out possibility of motor neuron disease. -Anti-GM 1 antibody was negative -Spinal fluid testing in July 2021 showed normal total protein, 45, negative oligoclonal banding -She cannot afford the high co-pay for IVIG, -Had a trial of prednisone in August 2021, up to 40 mg daily, slow tapering, reported no significant improvement, complains of worsening diabetes, tapered off Nov 2021, A1C was 7.5 at that time -Will reorder NCV/EMG given report of worsening weakness, falls, last was in July 2021 with Dr. Krista Blue  -Follow-up will be determined after NCV/EMG  Evangeline Dakin, DNP  [facility] [address] [place], [place] [address] [phone]

## 2020-10-19 NOTE — Patient Instructions (Signed)
Set up for nerve conduction  Keep medications the same for now

## 2020-10-20 ENCOUNTER — Ambulatory Visit (INDEPENDENT_AMBULATORY_CARE_PROVIDER_SITE_OTHER): Payer: Medicare HMO | Admitting: Neurology

## 2020-10-20 ENCOUNTER — Telehealth: Payer: Self-pay | Admitting: Neurology

## 2020-10-20 ENCOUNTER — Ambulatory Visit: Payer: Medicare HMO | Admitting: Neurology

## 2020-10-20 DIAGNOSIS — R1310 Dysphagia, unspecified: Secondary | ICD-10-CM

## 2020-10-20 DIAGNOSIS — M6281 Muscle weakness (generalized): Secondary | ICD-10-CM

## 2020-10-20 DIAGNOSIS — R29898 Other symptoms and signs involving the musculoskeletal system: Secondary | ICD-10-CM | POA: Diagnosis not present

## 2020-10-20 DIAGNOSIS — R4189 Other symptoms and signs involving cognitive functions and awareness: Secondary | ICD-10-CM

## 2020-10-20 DIAGNOSIS — G1220 Motor neuron disease, unspecified: Secondary | ICD-10-CM | POA: Diagnosis not present

## 2020-10-20 NOTE — Telephone Encounter (Signed)
[employer] medicare order sent to GI. They will obtain the auth and reach out to the patient to schedule.  °

## 2020-10-20 NOTE — Progress Notes (Signed)
 Assessment and plan:  Motor neuron disease  Patient complains of painless muscle atrophy, weakness over the past few years now complains of increased bilateral upper extremity, lower extremity weakness, even dysphagia, shortness of breath,  Repeat EMG nerve conduction study today Oct 20, 2020, continue to demonstrate progressive widespread neuropathic changes including cervical, bilateral lumbar sacral myotomes, even subtle neuropathic changes involving left sternocleidomastoid, left orbicularis oculi, above findings most consistent with motor neuron disease, there is also correlate with her examination, hyperreflexia of bilateral upper and lower extremity, in the setting of significant muscle atrophy, weakness  Previous extensive imaging, and laboratory evaluation failed to demonstrate treatable etiology,  After extensive discussion with patient, and her mother-in-law, we decided to proceed with MRI of thoracic, and lumbar spine, CT of the chest which was not performed in the past, to rule out any treatable etiology  She is to continue to follow with GI physician for her complaints of difficulty swallowing,  Also encourage patient to discuss with her family about long-term care planning if she continue to worsen to the point of difficulty swallowing/breathing, whether she would consider supportive care,  History of brain injury, cardiac arrest in 2014 Recurrent spells of transient loss of consciousness  Possible partial seizure, improved with lamotrigine treatment  EEG normal in November 2021; EEG in June 2020 showed no significant abnormality  Lamictal level was 5.5 in November 2021  Continue current dose of lamotrigine 100/200 mg  Gradual worsening cognitive impairment  Most related to her anoxic brain injury, brain trauma from fall  Continue Namenda 10 mg twice a day  Data reviewed  MRI of the cervical spine without contrast in June 2020  1.   The spinal cord appears normal. 2.   The  central canal is narrowed at C3-C4, C5-C6 and C6-C7 but not enough to be considered spinal stenosis. 3.   Multilevel degenerative changes as detailed above.  There is no definite nerve root compression, though there is moderate left foraminal narrowing at C3-C4 encroaching upon the left C4 nerve root 4.   No significant changes compared to the 11/11/2015 MRI.  MRI scan of the brain in May 2020, small vessel disease and possible remote age lacunar infarct in the right periventricular white matter.  Overall no significant change compared with previous MRI from 02/25/2018  CSF July 2021: WBC 2, glucose 74, protein 45  Laboratory evaluations in 2021: Normal A1c, lamotrigine 5.5, ANA, TSH, CPK, copper, ferritin, negative anti-GM 1 antibody, lipid profile, CBC, elevated WBC of 13.3, CMP, previously negative HIV, RPR, normal folic acid, C-reactive protein,   HISTORICAL  Tammy Mcknight is a 65-year-old female, seen in request by her primary care Dr. Robert Dougfor evaluation of dizziness, unsteady gait,  I have reviewed and summarized the referring note from the referring physician.  Also revealed multiple [facility] chart, patient was able to provide limited detail in her previous injury,  She fell off her porch while feeding the bird in 2014, when she awakened she felt confused, but was able to reach out to her husband to call for help, was initially treated at [facility], later was transferred to [facility], per record, she suffered a ruptured spleen, head injury, she received massive blood transfusion, also reported acute renal failure, per patient, during hospital stay she also suffered cardiac arrest  She also had a history of right hip replacement in May 2019,  She used to work in home health aide, 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   I reviewed outside record, MRI of brain in June 2017, right maxillary sinus is opacified with chronic inflammatory changes, there also other evidence of chronic sinusitis, in left maxillary sinus.  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  There was no evidence of acute ischemic changes.   MRI of cervical spine showed degenerative changes, there was no significant canal or foraminal narrowing  UPDATE December 02 2018 She is accompanied by her mother-in-law Tammy Mcknight at today's visit, who provides supplementary history,  Since falling off porch of 10 feet in 2014, patient was noted to have significant memory loss, gradually getting worse, she had abdominal surgery, also had few cardiac arrest need resuscitation during the incident, since then, she has episode of transient dizziness, mild confusion, lasting less than 1 minute, no loss of consciousness, no seizure-like activity noticed, she is having recurrect spells almost daily basis over the past few months,  She also has strong family history of dementia, both father and mother died of dementia in their 70s  She was noted to repeating herself, got lost while driving, and making comments that is not in line with reality,  We have personally reviewed MRI of the brain without contrast May 2020, mild changes of chronic microvascular ischemia, possible remote lacunar infarction in the right periventricular white matter, overall no change compared to previous MRI from September 2019   She was also noted to  have significant atrophy, weakness of left hand, arm numbness involving left hand to left elbow level  UPDATE Mar 25 2019: She is accompanied by her mother-in-law at today's clinic visit, She continues to decline slowly, mild worsening gait abnormality, mental slowing, confusion, difficult to keep up with her medications,  Pulmonary functional test in August 2020 The FVC, FEV1, FEV1/FVC ratio and FEF25-75% are within normal limits. Lung volumes are within normal limits. Following administration of bronchodilators, there is no significant response. The reduced diffusing capacity indicates a minimal loss of functional alveolar capillary surface. However, the diffusing capacity was not corrected for the patient's hemoglobin. 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 Anemia cannot be excluded as a potential cause of the diffusion defect without correcting the observed diffusing capacity for hemoglobin.  EMG nerve conduction study in August showed findings worrisome for motor neuron disease, there is also evidence of moderate right carpal tunnel syndrome.  Laboratory evaluations in June 2020 showed normal or negative RPR, B12, HIV, folic acid, C-reactive protein, TSH, CPK, ESR, CBC, CMP showed mild elevated glucose 123  I personally reviewed MRI of cervical spine in June 2020: Mild multilevel degenerative changes, there was no significant canal or foraminal stenosis  UPDATE November 24 2019: She is accompanied by her husband at today's clinical visit, there was no significant change, continue has gait abnormality, muscle atrophy, memory loss  Previous EMG nerve conduction study August 2020 showed evidence of chronic neuropathic changes involving bilateral cervical, lumbar sacral myotomes, we will repeat EMG   nerve conduction study   UPDATE December 31 2019: Patient is accompanied by her husband for electrodiagnostic study today, which continues show evidence of  significant chronic neuropathic changes involving left T1, C8, C7 myotomes then the more proximal left cervical myotomes.  There is also subtle evidence of chronic neuropathic changes involving bilateral lower extremities.  There was no significant change compared to previous study in August 2020.  Clinical wise, patient sent her husband continue complains of slow worsening, including gait abnormality, upper extremity muscle weakness, she denies significant sensory loss, she also had gradual worsening mental confusion,  UPDATE Apr 19 2020: She is accompanied today's clinical visit, she was started on prednisone since August 2021, starting from 40 mg daily, tapering down to 20 mg daily, complains of worsening diabetes, no significant improvement in his muscle weakness,  She continue complains of almost daily function spells, feeling weak, has to be helped her husband to get up from low position, go to bed, no loss of consciousness  She also reported normal cardiac monitoring about 2 months ago, that was ordered by her primary care physician. There was no event during 1 week cardiac monitoring  She reported significant improvement for her similar spells in the past when she was started on lamotrigine 100 mg twice a day, tolerating medication well  Reviewed extensive laboratory evaluations in 2021, negative GM 1 antibody, normal ferritin 95, copper, ANA, CPK, TSH,  Spinal fluid testing July 2021, normal total protein 45, VDRL, negative oligoclonal banding, WBC of 2  Update Oct 19, 2020  She is accompanied by her mother-in-law for repeat EMG nerve conduction study, which showed widespread chronic neuropathic changes, active process involving left cervical myotomes, definite chronic neuropathic changes involving bilateral lumbosacral myotomes, subtle neuropathic changes even involving left sternocleidomastoid, orbicularis oculi, with her pain is progressive muscle weakness, atrophy, progressive  functional status including gait abnormality, even dysphagia, shortness of breath, above findings 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  PHYSICAL EXAMNIATION:  Gen: NAD, conversant, well nourised,well groomed, thoughts are scattered                  NEUROLOGICAL EXAM:  MMSE - Mini Mental State Exam 10/19/2020 09/02/2019 03/25/2019  Orientation to time 5 4 5  Orientation to Place 5 4 5  Registration 3 3 3  Attention/ Calculation 5 0 5  Recall 2 2 2  Language- name 2 objects 2 2 2  Language- repeat 0 0 1  Language- follow 3 step command 1 3 3  Language- read & follow direction 1 1 1  Write a sentence 1 1 1  Copy design 0 0 0  Total score 25 20 28  animal naming 8   CRANIAL NERVES: CN II: Visual fields are full to confrontation.  Pupils are round equal and briskly reactive to light. CN III, IV, VI: extraocular movement are normal. No ptosis. CN V: Facial sensation is intact to pinprick in all 3 divisions bilaterally.   CN VII: Face is symmetric with normal eye closure and smile.  Mild cheek puff weakness   CN XI: Head turning and shoulder shrug are intact  MOTOR: Significant left hand intrinsic muscle atrophy, moderate weakness of bilateral shoulder abduction, elbow flexion, extension, left more than right wrist extension, flexion, grip,  Mild bilateral hip flexion, knee flexion, extension weakness, no significant ankle dorsiflexion  weakness,   REFLEXES: Reflexes are 2+, but 3+ at the knees, trace at ankles, bilateral plantar responses were extensor  SENSORY: Intact to soft touch to all extremities  COORDINATION: Finger-nose-finger and heel-to-shin is normal bilaterally  GAIT/STANCE: Gait is slightly wide-based, cautious, stiff, unsteady,  REVIEW OF SYSTEMS: Full 14 system review of systems performed and notable only for as above  See HPI  ALLERGIES: No Known Allergies  HOME MEDICATIONS: Current Outpatient Medications  Medication Sig Dispense Refill  . atorvastatin (LIPITOR) 40 MG tablet Take 40 mg by mouth daily.    . Cholecalciferol (VITAMIN D3 PO) Take 1,000 Units by mouth daily.    . cinacalcet (SENSIPAR) 30 MG tablet Take 30 mg by mouth 3 (three) times a week.     . citalopram (CELEXA) 40 MG tablet Take 40 mg by mouth daily.    . labetalol (NORMODYNE) 200 MG tablet Take 400 mg by mouth 2 (two) times daily.    . lamoTRIgine (LAMICTAL) 100 MG tablet One in the am, 2 tablets at night 270 tablet 4  . memantine (NAMENDA) 10 MG tablet Take 1 tablet (10 mg total) by mouth 2 (two) times daily. 180 tablet 4  . valsartan (DIOVAN) 320 MG tablet TAKE 1 TABLET BY MOUTH IN THE MORNING FOR HIGH BLOOD PRESSURE     No current facility-administered medications for this visit.    PAST MEDICAL HISTORY: Past Medical History:  Diagnosis Date  . Acne rosacea   . Anoxic brain injury (HCC)   . Arthritis   . Ataxia   . Bilateral carpal tunnel syndrome   . Breast lesion on mammography    Left  . Bulging lumbar disc   . Cardiac arrest due to trauma (HCC)    From a fall off of her porch 2014  . Carpal tunnel syndrome of left wrist   . Cervical radiculopathy at C8   . 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 Depression   . 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 Dizziness due to old head injury   . Elevated liver enzymes   . Generalized osteoarthritis of multiple sites   . GERD (gastroesophageal reflux  disease)   . Herniated nucleus pulposus, C4-5 left   . Hypercalcemia   . Hypertension   . Hyperthyroidism   . Hypoxemic encephalopathy (HCC)   . Left hand weakness   . Lumbar radiculopathy   . Memory loss   . Menopausal syndrome   . 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HCC)   . Pharyngeal dysphagia   . RLS (restless legs syndrome)   . Thyroid nodule     PAST SURGICAL HISTORY: Past Surgical History:  Procedure Laterality Date  . ABDOMINAL HYSTERECTOMY    . BREAST LUMPECTOMY WITH RADIOACTIVE SEED LOCALIZATION Left 05/13/2018   Procedure: LEFT BREAST LUMPECTOMY WITH RADIOACTIVE SEED LOCALIZATION;  Surgeon: Toth, Paul III, MD;  Location: [facility] OR;  Service: General;  Laterality: Left;  . BREAST SURGERY     right benign lump  . HIP ARTHROPLASTY     Right  . SPLENECTOMY, TOTAL    . TONSILLECTOMY    . TONSILLECTOMY AND ADENOIDECTOMY    . TUBAL LIGATION        FAMILY HISTORY: Family History  Problem Relation Age of Onset  . Heart failure Mother   . Dementia Mother   . Hypertension Mother   . Fibromyalgia Mother   . Migraines Mother   . Lupus Mother   . Dementia Father   . Hypertension Father   . Leukemia Father     SOCIAL HISTORY:   Social History   Socioeconomic History  . Marital status: Married    Spouse name: Not on file  . Number of children: 2  . Years of education: GED  . Highest education level: Not on file  Occupational History  . Occupation: Diabled  Tobacco Use  . Smoking status: Never Smoker  . Smokeless tobacco: Never Used  Vaping Use  . Vaping Use: Never used  Substance and Sexual Activity  . Alcohol use: Not Currently  . Drug use: Never  . Sexual activity: Not on file  Other Topics Concern  . Not on file  Social History Narrative   Lives at home with her husband.   Right-handed.   Occasional tea.   Social Determinants of Health   Financial Resource Strain: Not on file  Food Insecurity: Not on file  Transportation Needs: Not on file  Physical  Activity: Not on file  Stress: Not on file  Social Connections: Not on file  Intimate Partner Violence: Not on file     Marcial Pacas, M.D. Ph.D.  [facility] [place], [place] [address] Phone: [phone] Fax:      [phone]

## 2020-10-20 NOTE — Procedures (Signed)
Full Name: Tammy Mcknight Gender: Female MRN #: [id number] Date of Birth: 11-15-1955    Visit Date: 10/20/2020 10:31 Age: 65 Years Examining Physician: Marcial Pacas, MD  Referring Physician: Butler Denmark, NP History: 65 year old female, with progressive painless muscle weakness, atrophy,  Summary of the test: Nerve conduction study: Bilateral ulnar, left superficial peroneal sensory responses were normal.  Bilateral median sensory response showed moderately prolonged peak latency with moderately decreased snap amplitude.  Left peroneal to EDB, right ulnar, median motor responses were normal.  Left ulnar motor response showed significantly decreased CMAP amplitude, with mildly prolonged distal latency.  Left median motor responses were absent.  Electromyography: Extensive needle examination was performed at selected muscles at bilateral lower extremity, left upper extremity, left thoracic paraspinal, left bulbar muscles.  There was definite active neuropathic changes involving left cervical myotomes.  There is also evidence of chronic neuropathic changes involving bilateral lumbar sacral myotomes, left sternocleidomastoid, and left orbicularis oculi.    There was no spontaneous activity at left lumbosacral paraspinals, mid and lower thoracic, and cervical paraspinal muscles.   Conclusion: This is an abnormal study.  There is evidence of widespread chronic neuropathic changes involving cervical, bilateral lumbosacral myotomes, there is also subtle chronic neuropathic changes involving left bulbar muscles.  Above findings in the right clinical settings support a diagnosis of motor neuron disease, compared to previous study in July 2021, there was no obvious chronic neuropathic changes noticed at bilateral lumbosacral myotomes.    Marcial Pacas, M.D. PhD  [facility] [address], [place], [place] [address] Tel: [phone] Fax: [phone]  Verbal informed  consent was obtained from the patient, patient was informed of potential risk of procedure, including bruising, bleeding, hematoma formation, infection, muscle weakness, muscle pain, numbness, among others.        [place]    Nerve / Sites Muscle Latency Ref. Amplitude Ref. Rel Amp Segments Distance Velocity Ref. Area    ms ms mV mV %  cm m/s m/s mVms  R Median - APB     Wrist APB 4.4 ?4.4 4.7 ?4.0 100 Wrist - APB 7   15.1     Upper arm APB 9.1  4.3  89.8 Upper arm - Wrist 23 49 ?49 14.1  L Median - APB     Wrist APB NR ?4.4 NR ?4.0 NR Wrist - APB 7   NR     Upper arm APB NR  NR  NR Upper arm - Wrist 21 NR ?49 NR  R Ulnar - ADM     Wrist ADM 3.1 ?3.3 10.0 ?6.0 100 Wrist - ADM 7   24.5     B.Elbow ADM 6.5  8.5  85.2 B.Elbow - Wrist 19 56 ?49 20.8     A.Elbow ADM 8.3  7.5  87.8 A.Elbow - B.Elbow 10 56 ?49 20.8  L Ulnar - ADM     Wrist ADM 5.5 ?3.3 0.5 ?6.0 100 Wrist - ADM 7   0.5     B.Elbow ADM 9.4  0.4  78.9 B.Elbow - Wrist 19 49 ?49 0.4     A.Elbow ADM 11.4  0.4  95.8 A.Elbow - B.Elbow 10 49 ?49 0.3  L Peroneal - EDB     Ankle EDB 5.3 ?6.5 3.3 ?2.0 100 Ankle - EDB 9   9.3     Fib head EDB 12.0  3.1  93.9 Fib head - Ankle 29 44 ?44 9.0  Pop fossa EDB 14.3  3.0  95.6 Pop fossa - Fib head 10 44 ?44 9.1         Pop fossa - Ankle                   SNC    Nerve / Sites Rec. Site Peak Lat Ref.  Amp Ref. Segments Distance    ms ms V V  cm  L Superficial peroneal - Ankle     Lat leg Ankle 4.3 ?4.4 6 ?6 Lat leg - Ankle 14  R Median - Orthodromic (Dig II, Mid palm)     Dig II Wrist 4.3 ?3.4 5 ?10 Dig II - Wrist 13  L Median - Orthodromic (Dig II, Mid palm)     Dig II Wrist 4.1 ?3.4 4 ?10 Dig II - Wrist 13  R Ulnar - Orthodromic, (Dig V, Mid palm)     Dig V Wrist 3.0 ?3.1 5 ?5 Dig V - Wrist 11  L Ulnar - Orthodromic, (Dig V, Mid palm)     Dig V Wrist 3.1 ?3.1 6 ?5 Dig V - Wrist 54               F  Wave    Nerve F Lat Ref.   ms ms  R Ulnar - ADM 30.9 ?32.0  L Ulnar - ADM 41.5  ?32.0         EMG Summary Table    Spontaneous MUAP Recruitment  Muscle IA Fib PSW Fasc Other Amp Dur. Poly Pattern  L. Tibialis anterior Normal None None None _______ Increased Increased 1+ Reduced  L. Tibialis posterior Normal None None None _______ Increased Increased 1+ Reduced  L. Peroneus longus Normal None None None _______ Normal Increased 1+ Reduced  L. Gastrocnemius (Medial head) Normal None None None _______ Normal Increased 1+ Reduced  L. Vastus lateralis Normal None None None _______ Normal Increased 1+ Reduced  R. Tibialis anterior Normal None None None _______ Normal Increased 1+ Reduced  R. Tibialis posterior Normal None None None _______ Normal Increased 1+ Reduced  R. Peroneus longus Normal None None None _______ Normal Increased 1+ Reduced  R. Vastus lateralis Normal None None None _______ Increased Increased 1+ Reduced  R. Lumbar paraspinals (low) Normal None None None _______ Normal Normal Normal Normal  R. Lumbar paraspinals (mid) Normal None None None _______ Normal Normal Normal Normal  L. Lumbar paraspinals (low) Normal None None None _______ Normal Normal Normal Normal  L. Lumbar paraspinals (mid) Normal None None None _______ Normal Normal Normal Normal  L. First dorsal interosseous Increased 1+ None None _______ Increased Increased 1+ Reduced  L. Lumbar paraspinals Normal None None None _______ Normal Normal Normal Normal  L. Pronator teres Normal None None None _______ Normal Normal Normal Reduced  L. Biceps brachii Normal None None None _______ Normal Normal Normal Reduced  L. Deltoid Normal None None None _______ Normal Normal Normal Reduced  L. Brachioradialis Increased None None None _______ Increased Increased 1+ Reduced  L. Cervical paraspinals Normal None None None _______ Normal Normal Normal Normal  L. Thoracic paraspinals (low) Normal None None None _______ Normal Normal Normal Normal  L. Thoracic paraspinals (mid) Normal None None None _______ Normal  Normal Normal Normal  L. Sternocleidomastoid Normal None None None _______ Normal Increased 1+ Reduced  L. Orbicularis oculi Increased None None None _______ Normal Normal Normal Reduced

## 2020-10-27 ENCOUNTER — Telehealth: Payer: Self-pay | Admitting: *Deleted

## 2020-10-27 LAB — ACETYLCHOLINE RECEPTOR AB, ALL
AChR Binding Ab, Serum: <0.03 nmol/L (ref 0.00–0.24)
Acetylchol Block Ab: 16 % (ref 0–25)

## 2020-10-27 LAB — MUSK ANTIBODIES: MuSK Antibodies: <1 U/mL

## 2020-10-27 LAB — TSH: TSH: 3.38 u[IU]/mL (ref 0.450–4.500)

## 2020-10-27 NOTE — Telephone Encounter (Signed)
-----   Message from Marcial Pacas, MD sent at 10/27/2020  2:36 PM EDT ----- Please call patient for normal laboratory result

## 2020-10-27 NOTE — Telephone Encounter (Signed)
I spoke to patient and provided her with the lab results.

## 2020-10-31 ENCOUNTER — Other Ambulatory Visit: Payer: Medicare HMO

## 2020-11-05 ENCOUNTER — Ambulatory Visit
Admission: RE | Admit: 2020-11-05 | Discharge: 2020-11-05 | Disposition: A | Discharge status: Home / Self Care | Payer: Medicare HMO | Source: Ambulatory Visit | Attending: Neurology | Admitting: Neurology

## 2020-11-05 ENCOUNTER — Other Ambulatory Visit: Payer: Self-pay

## 2020-11-05 DIAGNOSIS — R1310 Dysphagia, unspecified: Secondary | ICD-10-CM

## 2020-11-05 DIAGNOSIS — M6281 Muscle weakness (generalized): Secondary | ICD-10-CM

## 2020-11-05 DIAGNOSIS — G1220 Motor neuron disease, unspecified: Secondary | ICD-10-CM

## 2020-11-05 DIAGNOSIS — R4189 Other symptoms and signs involving cognitive functions and awareness: Secondary | ICD-10-CM

## 2020-11-05 DIAGNOSIS — R29898 Other symptoms and signs involving the musculoskeletal system: Secondary | ICD-10-CM

## 2020-11-09 ENCOUNTER — Telehealth: Payer: Self-pay | Admitting: Neurology

## 2020-11-09 NOTE — Telephone Encounter (Signed)
I spoke to the patient and provided her with CT chest, MRI thoracic and MRI lumbar results. She verbalized understanding. She denies ever being a smoker.

## 2020-11-09 NOTE — Telephone Encounter (Addendum)
IMPRESSION: 1. Trace pericardial effusion. 2. No acute airspace disease. 3. 3 mm subpleural right lower lobe pulmonary nodule. No follow-up needed if patient is low-risk. Non-contrast chest CT can be considered in 12 months if patient is high-risk. This recommendation follows the consensus statement: Guidelines for Management of Incidental Pulmonary Nodules Detected on CT Images: From the Fleischner Society 2017; Radiology 2017; 284:[id number]. 4.  Aortic Atherosclerosis (ICD10-I70.0). IMPRESSION: This MRI of the lumbar spine without contrast shows the following: 1.   At L2-L3, there are degenerative changes causing mild left foraminal narrowing and lateral recess stenosis but no spinal stenosis or nerve root compression. 2.   At L3-L4, there are degenerative changes causing mild bilateral foraminal narrowing and lateral recess stenosis but no spinal stenosis or nerve root compression. 3.   At L4-L5, there are degenerative changes causing moderate right foraminal narrowing and mild to moderate right lateral recess stenosis but no spinal stenosis or nerve root compression.   IMPRESSION: This MRI of the thoracic spine without contrast shows the following: 1.   The spinal cord appears normal. 2.   Disc degenerative changes are noted in the lower cervical spine and upper thoracic spine as detailed above.  These do not lead to nerve root compression or spinal stenosis.  Please call patient, CT of the chest showed aortic atherosclerosis, trace pericardial effusion, 3 mm right lower lobe nodule, if she has no history of smoking, radiologist consider it is a low risk findings.   there was no significant abnormality otherwise  MRI of thoracic spine showed mild degenerative changes, but no evidence of spinal cord or nerve root compression  MRI of lumbar spine showed degenerative changes, no evidence of spinal cord or nerve root compression

## 2021-02-24 DIAGNOSIS — Z124 Encounter for screening for malignant neoplasm of cervix: Secondary | ICD-10-CM | POA: Insufficient documentation

## 2021-03-16 NOTE — Progress Notes (Signed)
Chart reviewed, agree above plan ?

## 2021-04-28 ENCOUNTER — Ambulatory Visit: Payer: Medicare HMO | Admitting: Neurology

## 2021-04-28 ENCOUNTER — Encounter: Payer: Self-pay | Admitting: Neurology

## 2021-04-28 VITALS — BP 150/79 | HR 84 | Ht 66.0 in | Wt 155.0 lb

## 2021-04-28 DIAGNOSIS — G1220 Motor neuron disease, unspecified: Secondary | ICD-10-CM | POA: Diagnosis not present

## 2021-04-28 DIAGNOSIS — R4189 Other symptoms and signs involving cognitive functions and awareness: Secondary | ICD-10-CM | POA: Diagnosis not present

## 2021-04-28 DIAGNOSIS — R29898 Other symptoms and signs involving the musculoskeletal system: Secondary | ICD-10-CM | POA: Diagnosis not present

## 2021-04-28 DIAGNOSIS — M62532 Muscle wasting and atrophy, not elsewhere classified, left forearm: Secondary | ICD-10-CM

## 2021-04-28 DIAGNOSIS — R413 Other amnesia: Secondary | ICD-10-CM

## 2021-04-28 MED ORDER — LAMOTRIGINE 100 MG PO TABS: ORAL_TABLET | ORAL | 4 refills | Status: DC

## 2021-04-28 NOTE — Progress Notes (Signed)
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  Patient complains of painless muscle atrophy, weakness over the past few years now complains of increased bilateral upper extremity, lower extremity weakness, even dysphagia, shortness of breath,  Repeat EMG nerve conduction study today Oct 20, 2020, continue to demonstrate progressive widespread neuropathic changes including cervical, bilateral lumbar sacral myotomes, even subtle neuropathic changes involving left sternocleidomastoid, left orbicularis oculi, above findings most suggestive of motor neuron disease, there is also correlate with her examination, hyperreflexia of bilateral upper and lower extremity, in the setting of significant muscle atrophy, weakness  Previous extensive imaging including MRI of the brain, cervical, thoracic, lumbar spine, and laboratory evaluation failed to demonstrate treatable etiology,  She is to continue to follow with GI physician for her complaints of difficulty swallowing,   History of brain injury, cardiac arrest in 2014 Recurrent spells of transient loss of consciousness  Possible partial seizure versus orthostatic syncope, improved with lamotrigine treatment  EEG normal in November 2021; EEG in June 2020 showed no significant abnormality  Lamictal level was 5.5 in November 2021  Continue current dose of lamotrigine 100/200 mg  Gradual worsening cognitive impairment  Most related to her anoxic brain injury, brain trauma from fall  Continue Namenda 10 mg twice a day  MoCA 14/30  Data reviewed  MRI of the cervical spine without contrast in June 2020  1.   The spinal cord appears normal. 2.   The central canal is narrowed at C3-C4, C5-C6 and C6-C7 but not enough to be considered spinal stenosis. 3.   Multilevel degenerative changes as detailed above.  There is no definite nerve root compression, though there is moderate left foraminal narrowing at C3-C4 encroaching upon the left C4 nerve root 4.    No significant changes compared to the 11/11/2015 MRI.   MRI scan of the brain in May 2020, small vessel disease and possible remote age lacunar infarct in the right periventricular white matter.  Overall no significant change compared with previous MRI from 02/25/2018  MRI of thoracic spine in May 2022 showed no significant abnormality.  MRI of lumbar spine in May 2022 multilevel degenerative changes, no significant canal or foraminal narrowing  CT of the chest without contrast.  Trace pericardial effusion, no acute airspace disease,  CSF July 2021: WBC 2, glucose 74, protein 45  Laboratory evaluations in 2021: Normal A1c, lamotrigine 5.5, ANA, TSH, CPK, copper, ferritin, negative anti-GM 1 antibody, lipid profile, CBC, elevated WBC of 13.3, CMP, previously negative HIV, RPR, normal folic acid, C-reactive protein,   HISTORICAL  Tammy Mcknight is a 65 year old female, seen in request by her primary care Dr. Herbie Baltimore Dougfor evaluation of dizziness, unsteady gait,  I have reviewed and summarized the referring note from the referring physician.  Also revealed multiple [facility] chart, patient was able to provide limited detail in her previous injury,  She fell off her porch while feeding the bird in 2014, when she awakened she felt confused, but was able to reach out to her husband to call for help, was initially treated at [facility], later was transferred to [facility], per record, she suffered a ruptured spleen, head injury, she received massive blood transfusion, also reported acute renal failure, per patient, during hospital stay she also suffered cardiac arrest  She also had a history of right hip replacement in May 2019,  She used to work in home health aide, 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   I reviewed outside record, MRI of brain in June 2017, right maxillary sinus is opacified with chronic inflammatory changes, there also other evidence of chronic sinusitis, in left maxillary sinus.  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  There was no evidence of acute ischemic changes.   MRI of cervical spine showed degenerative changes, there was no significant canal or foraminal narrowing  UPDATE December 02 2018 She is accompanied by her mother-in-law [name] at today's visit, who provides supplementary history,  Since falling off porch of 10 feet in 2014, patient was noted to have significant memory loss, gradually getting worse, she had abdominal surgery, also had few cardiac arrest need resuscitation during the incident, since then, she has episode of transient dizziness, mild confusion, lasting less than 1 minute, no loss of consciousness, no seizure-like activity noticed, she is having recurrect spells almost daily basis over the past few months,  She also has strong family history of dementia, both father and mother died of dementia in their 64s  She was noted to repeating herself, got lost while driving, and making comments that is not in line with reality,  We have personally reviewed MRI of the brain without contrast May 2020, mild changes of chronic microvascular ischemia, possible remote lacunar infarction in the right periventricular white matter, overall no change compared to previous MRI from September 2019   She was also noted to have significant atrophy,  weakness of left hand, arm numbness involving left hand to left elbow level  UPDATE Mar 25 2019: She is accompanied by her mother-in-law at today's clinic visit, She continues to decline slowly, mild worsening gait abnormality, mental slowing, confusion, difficult to keep up with her medications,  Pulmonary functional test in August 2020 The FVC, FEV1, FEV1/FVC ratio and FEF25-75% are within normal limits. Lung volumes are within normal limits. Following administration of bronchodilators, there is no significant response. The reduced diffusing capacity indicates a minimal loss of functional alveolar capillary surface. However, the diffusing capacity was not corrected for the patient's hemoglobin. 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 Anemia cannot be excluded as a potential cause of the diffusion defect without correcting the observed diffusing capacity for hemoglobin.  EMG nerve conduction study in August showed findings worrisome for motor neuron disease, there is also evidence of moderate right carpal tunnel syndrome.  Laboratory evaluations in June 2020 showed normal or negative RPR, T15, HIV, folic acid, C-reactive protein, TSH, CPK, ESR, CBC, CMP showed mild elevated glucose 123  I personally reviewed MRI of cervical spine in June 2020: Mild multilevel degenerative changes, there was no significant canal or foraminal stenosis  UPDATE November 24 2019: She is accompanied by her husband at today's clinical visit, there was no significant change, continue has gait abnormality, muscle atrophy, memory loss  Previous EMG nerve conduction study August 2020 showed evidence of chronic neuropathic changes involving bilateral cervical, lumbar sacral myotomes, we will repeat EMG nerve conduction study  UPDATE December 31 2019: Patient is accompanied by her husband for electrodiagnostic study today, which continues show evidence of significant chronic  neuropathic changes involving left T1, C8, C7 myotomes then the more proximal left cervical myotomes.  There is also subtle evidence of chronic neuropathic changes involving bilateral lower extremities.  There was no significant change compared to previous study in August 2020.  Clinical wise, patient sent her husband continue complains of slow worsening, including gait abnormality, upper extremity muscle weakness, she denies significant sensory loss, she also had gradual worsening mental confusion,  UPDATE Apr 19 2020: She is accompanied today's clinical visit, she was started on prednisone since August 2021, starting from 40 mg daily, tapering down to 20 mg daily, complains of worsening diabetes, no significant improvement in his muscle weakness,  She continue complains of almost daily function spells, feeling weak, has to be helped her husband to get up from low position, go to bed, no loss of consciousness  She also reported normal cardiac monitoring about 2 months ago, that was ordered by her primary care physician. There was no event during 1 week cardiac monitoring  She reported significant improvement for her similar spells in the past when she was started on lamotrigine 100 mg twice a day, tolerating medication well  Reviewed extensive laboratory evaluations in 2021, negative GM 1 antibody, normal ferritin 95, copper, ANA, CPK, TSH,  Spinal fluid testing July 2021, normal total protein 45, VDRL, negative oligoclonal banding, WBC of 2  Update Oct 19, 2020  She is accompanied by her mother-in-law for repeat EMG nerve conduction study, which showed widespread chronic neuropathic changes, active process involving left cervical myotomes, definite chronic neuropathic changes involving bilateral lumbosacral myotomes, subtle neuropathic changes even involving left sternocleidomastoid, orbicularis oculi, with her pain is progressive muscle weakness, atrophy, progressive functional status including  gait abnormality, even dysphagia, shortness of breath, above findings 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  UPDATE Apr 28 2021: She is more forgetful, misplace things, leave door open,  She also has intermittent increased weakness, needing help to be in bed, more difficulty buttoning,   She denies swallowing difficulty,  Her depression is under good control with celexa 59m daily, She also has episodes of really lightheaded, usually happen in standing station  PHYSICAL EXAMNIATION:  Gen: NAD, conversant, well nourised,well groomed, thoughts are scattered                  NEUROLOGICAL EXAM:  [place]  04/28/2021  Visuospatial/ Executive (0/5) 1  Naming (0/3) 3  Attention: Read list of digits (0/2) 0  Attention: Read list of letters (0/1) 1  Attention: Serial 7 subtraction starting at 100 (0/3) 0  Language: Repeat phrase (0/2) 0  Language : Fluency (0/1) 1  Abstraction (0/2) 2  Delayed Recall (0/5) 0  Orientation (0/6) 6  Total 14      CRANIAL NERVES: CN II: Visual fields are full to confrontation.  Pupils are round equal and briskly reactive to light. CN III, IV, VI: extraocular movement are normal. No ptosis.  CN V: Facial sensation is intact to pinprick in all 3 divisions bilaterally.  CN VII: Face is symmetric with normal eye closure and smile.  Mild cheek puff weakness CN XI: Head turning and shoulder shrug are  intact  MOTOR: Worsening significant left hand intrinsic muscle atrophy, moderate weakness of bilateral shoulder abduction, elbow flexion, extension, left more than right wrist extension, flexion, grip, . Mild bilateral hip flexion, knee flexion, extension weakness, no significant ankle dorsiflexion weakness,   REFLEXES: Reflexes are 2+,   3+ at the knees, trace at ankles, bilateral plantar responses were extensor  SENSORY: Intact to soft touch to all extremities  COORDINATION: Finger-nose-finger and heel-to-shin is normal bilaterally  GAIT/STANCE: Gait is slightly wide-based, cautious, stiff, unsteady,  REVIEW OF SYSTEMS: Full 14 system review of systems performed and notable only for as above  See HPI  ALLERGIES: No Known Allergies  HOME MEDICATIONS: Current Outpatient Medications  Medication Sig Dispense Refill   atorvastatin (LIPITOR) 40 MG tablet Take 40 mg by mouth daily.     Cholecalciferol (VITAMIN D3 PO) Take 1,000 Units by mouth daily.     cinacalcet (SENSIPAR) 30 MG tablet Take 30 mg by mouth 3 (three) times a week.      citalopram (CELEXA) 40 MG tablet Take 40 mg by mouth daily.     labetalol (NORMODYNE) 200 MG tablet Take 400 mg by mouth 2 (two) times daily.     lamoTRIgine (LAMICTAL) 100 MG tablet One in the am, 2 tablets at night 270 tablet 4   memantine (NAMENDA) 10 MG tablet Take 1 tablet (10 mg total) by mouth 2 (two) times daily. 180 tablet 4   valsartan (DIOVAN) 320 MG tablet TAKE 1 TABLET BY MOUTH IN THE MORNING FOR HIGH BLOOD PRESSURE     No current facility-administered medications for this visit.    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   Dizziness due to old head injury    Elevated liver enzymes    Generalized osteoarthritis of multiple sites    GERD (gastroesophageal reflux disease)    Herniated nucleus pulposus, C4-5 left    Hypercalcemia    Hypertension    Hyperthyroidism    Hypoxemic encephalopathy (HCC)    Left hand weakness    Lumbar radiculopathy    Memory loss    Menopausal syndrome    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HCC)    Pharyngeal dysphagia    RLS (restless legs syndrome)    Thyroid nodule     PAST SURGICAL HISTORY: Past Surgical History:  Procedure Laterality Date   ABDOMINAL HYSTERECTOMY     BREAST LUMPECTOMY WITH RADIOACTIVE SEED LOCALIZATION Left 05/13/2018   Procedure: LEFT BREAST LUMPECTOMY WITH RADIOACTIVE SEED LOCALIZATION;  Surgeon: Jovita Kussmaul, MD;  Location: [place];  Service: General;  Laterality: Left;   BREAST SURGERY     right benign lump   HIP ARTHROPLASTY  Right   SPLENECTOMY, TOTAL     TONSILLECTOMY     TONSILLECTOMY AND ADENOIDECTOMY     TUBAL LIGATION      FAMILY HISTORY: Family History  Problem Relation Age of Onset   Heart failure Mother    Dementia Mother    Hypertension Mother    Fibromyalgia Mother    Migraines Mother    Lupus Mother    Dementia Father    Hypertension Father    Leukemia Father     SOCIAL HISTORY:   Social History   Socioeconomic History   Marital status: Married    Spouse name: Not on file   Number of children: 2   Years of education: GED   Highest education level: Not on file  Occupational History   Occupation: Diabled  Tobacco Use   Smoking status: Never   Smokeless tobacco: Never  Vaping Use   Vaping Use: Never used  Substance and Sexual Activity   Alcohol use: Not Currently   Drug use: Never   Sexual activity: Not on file  Other Topics Concern   Not on file  Social History Narrative    Lives at home with her husband.   Right-handed.   Occasional tea.   Social Determinants of Health   Financial Resource Strain: Not on file  Food Insecurity: Not on file  Transportation Needs: Not on file  Physical Activity: Not on file  Stress: Not on file  Social Connections: Not on file  Intimate Partner Violence: Not on file     Marcial Pacas, M.D. Ph.D.  [facility] [place], [place] [address] Phone: [phone] Fax:      [phone]

## 2021-10-31 DIAGNOSIS — R61 Generalized hyperhidrosis: Secondary | ICD-10-CM | POA: Insufficient documentation

## 2021-10-31 DIAGNOSIS — R252 Cramp and spasm: Secondary | ICD-10-CM | POA: Insufficient documentation

## 2021-11-01 DIAGNOSIS — D509 Iron deficiency anemia, unspecified: Secondary | ICD-10-CM | POA: Insufficient documentation

## 2021-11-03 ENCOUNTER — Ambulatory Visit: Payer: Medicare HMO | Admitting: Neurology

## 2021-11-23 ENCOUNTER — Ambulatory Visit (INDEPENDENT_AMBULATORY_CARE_PROVIDER_SITE_OTHER): Payer: Medicare HMO | Admitting: Neurology

## 2021-11-23 ENCOUNTER — Encounter: Payer: Self-pay | Admitting: Neurology

## 2021-11-23 VITALS — BP 119/69 | HR 74 | Ht 67.0 in | Wt 152.0 lb

## 2021-11-23 DIAGNOSIS — R410 Disorientation, unspecified: Secondary | ICD-10-CM | POA: Diagnosis not present

## 2021-11-23 DIAGNOSIS — G1220 Motor neuron disease, unspecified: Secondary | ICD-10-CM | POA: Diagnosis not present

## 2021-11-23 DIAGNOSIS — R4189 Other symptoms and signs involving cognitive functions and awareness: Secondary | ICD-10-CM

## 2021-11-23 MED ORDER — LAMOTRIGINE 100 MG PO TABS: ORAL_TABLET | ORAL | 4 refills | Status: DC

## 2021-11-23 MED ORDER — MEMANTINE HCL 10 MG PO TABS: 10.0000 mg | ORAL_TABLET | Freq: Two times a day (BID) | ORAL | 4 refills | Status: DC

## 2021-11-23 NOTE — Progress Notes (Signed)
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  Patient complains of painless muscle atrophy, weakness over the past few years now complains of increased bilateral upper extremity, lower extremity weakness, even dysphagia, shortness of breath,  Repeat EMG nerve conduction study today Oct 20, 2020, continue to demonstrate progressive widespread neuropathic changes including cervical, bilateral lumbar sacral myotomes, even subtle neuropathic changes involving left sternocleidomastoid, left orbicularis oculi, above findings most suggestive of motor neuron disease, there is also correlate with her examination, hyperreflexia of bilateral upper and lower extremity, in the setting of significant muscle atrophy, weakness  Previous extensive imaging including MRI of the brain, cervical, thoracic, lumbar spine, and laboratory evaluation failed to demonstrate treatable etiology,  Continues to have gradual decline, encouraged to practice safe ambulation, use walker for stability, no major changes from prior exam in November 2022, there is notable left hand muscle atrophy   History of brain injury, cardiac arrest in 2014 Recurrent spells of transient loss of consciousness  Possible partial seizure versus orthostatic syncope, improved with lamotrigine treatment, spells generally occur in the standing position  EEG normal in November 2021; EEG in June 2020 showed no significant abnormality  Lamictal level was 5.5 in November 2021  Continue current dose of lamotrigine 100/200 mg  Stay well-hydrated with water  Gradual worsening cognitive impairment  Most related to her anoxic brain injury, brain trauma from fall  Continue Namenda 10 mg twice a day  MoCA 18/30 today  Have recommended against independent driving, have given information on driving evaluation  Follow-up in 6 months or sooner if needed with Dr. Krista Blue  Data reviewed  Copied Dr. Krista Blue 04/28/2021:  MRI of the cervical spine without contrast in  June 2020  1.   The spinal cord appears normal. 2.   The central canal is narrowed at C3-C4, C5-C6 and C6-C7 but not enough to be considered spinal stenosis. 3.   Multilevel degenerative changes as detailed above.  There is no definite nerve root compression, though there is moderate left foraminal narrowing at C3-C4 encroaching upon the left C4 nerve root 4.   No significant changes compared to the 11/11/2015 MRI.   MRI scan of the brain in May 2020, small vessel disease and possible remote age lacunar infarct in the right periventricular white matter.  Overall no significant change compared with previous MRI from 02/25/2018  MRI of thoracic spine in May 2022 showed no significant abnormality.  MRI of lumbar spine in May 2022 multilevel degenerative changes, no significant canal or foraminal narrowing  CT of the chest without contrast.  Trace pericardial effusion, no acute airspace disease,  CSF July 2021: WBC 2, glucose 74, protein 45  Laboratory evaluations in 2021: Normal A1c, lamotrigine 5.5, ANA, TSH, CPK, copper, ferritin, negative anti-GM 1 antibody, lipid profile, CBC, elevated WBC of 13.3, CMP, previously negative HIV, RPR, normal folic acid, C-reactive protein,   HISTORICAL  Tammy Mcknight is a 66 year old female, seen in request by her primary care Dr. Herbie Baltimore Dougfor evaluation of dizziness, unsteady gait,  I have reviewed and summarized the referring note from the referring physician.  Also revealed multiple [facility] chart, patient was able to provide limited detail in her previous injury,  She fell off her porch while feeding the bird in 2014, when she awakened she felt confused, but was able to reach out to her husband to call for help, was initially treated at [facility], later was transferred to [facility], per record, she suffered a  ruptured spleen, head injury, she received massive blood transfusion, also reported acute renal failure, per patient,  during hospital stay she also suffered cardiac arrest  She also had a history of right hip replacement in May 2019,  She used to work in home health aide, 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   I reviewed outside record, MRI of brain in June 2017, right maxillary sinus is opacified with chronic inflammatory changes, there also other evidence of chronic sinusitis, in left maxillary sinus.  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  There was no evidence of acute ischemic changes.   MRI of cervical spine showed degenerative changes, there was no significant canal or foraminal narrowing  UPDATE December 02 2018 She is accompanied by her mother-in-law [name] at today's visit, who provides supplementary history,  Since falling off porch of 10 feet in 2014, patient was noted to have significant memory loss, gradually getting worse, she had abdominal surgery, also had few cardiac arrest need resuscitation during the incident, since then, she has episode of transient dizziness, mild confusion, lasting less than 1 minute, no loss of consciousness, no seizure-like activity noticed, she is having recurrect spells almost daily basis over the past few months,  She also has strong family history of dementia, both father and mother died of dementia in their 79s  She was noted to repeating herself, got lost while driving, and making  comments that is not in line with reality,  We have personally reviewed MRI of the brain without contrast May 2020, mild changes of chronic microvascular ischemia, possible remote lacunar infarction in the right periventricular white matter, overall no change compared to previous MRI from September 2019   She was also noted to have significant atrophy, weakness of left hand, arm numbness involving left hand to left elbow level  UPDATE Mar 25 2019: She is accompanied by her mother-in-law at today's clinic visit, She continues to decline slowly, mild worsening gait abnormality, mental slowing, confusion, difficult to keep up with her medications,  Pulmonary functional test in August 2020 The FVC, FEV1, FEV1/FVC ratio and FEF25-75% are within normal limits. Lung volumes are within normal limits. Following administration of bronchodilators, there is no significant response. The reduced diffusing capacity indicates a minimal loss of functional alveolar capillary surface. However, the diffusing capacity was not corrected for the patient's hemoglobin. 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 Anemia cannot be excluded as a potential cause of the diffusion defect without correcting the observed diffusing capacity for hemoglobin.  EMG nerve conduction study in August showed findings worrisome for motor neuron disease, there is also evidence of moderate right carpal tunnel syndrome.  Laboratory evaluations in June 2020 showed normal or negative RPR, J62, HIV, folic acid, C-reactive protein, TSH, CPK, ESR, CBC, CMP showed mild elevated glucose 123  I personally reviewed MRI of cervical spine in June 2020: Mild multilevel degenerative changes, there was no significant canal or foraminal stenosis  UPDATE November 24 2019: She is accompanied by her husband at today's clinical visit, there was no significant change, continue has gait abnormality, muscle atrophy, memory  loss  Previous EMG nerve conduction study August 2020 showed evidence of chronic neuropathic changes involving bilateral cervical, lumbar sacral myotomes, we will repeat EMG nerve conduction study   UPDATE December 31 2019: Patient is accompanied by her husband for electrodiagnostic study today, which continues show evidence of significant chronic neuropathic changes involving left T1, C8, C7 myotomes then the more proximal left cervical myotomes.  There is also subtle evidence of chronic neuropathic changes involving bilateral lower extremities.  There was no significant change compared to previous study in August 2020.  Clinical wise, patient sent her husband continue complains of slow worsening, including gait abnormality, upper extremity muscle weakness, she denies significant sensory loss, she also had gradual worsening mental confusion,  UPDATE Apr 19 2020: She is accompanied today's clinical visit, she was started on prednisone since August 2021, starting from 40 mg daily, tapering down to 20 mg daily, complains of worsening diabetes, no significant improvement in his muscle weakness,  She continue complains of almost daily function spells, feeling weak, has to be helped her husband to get up from low position, go to bed, no loss of consciousness  She also reported normal cardiac monitoring about 2 months ago, that was ordered by her primary care physician. There was no event during 1 week cardiac monitoring  She reported significant improvement for her similar spells in the past when she was started on lamotrigine 100 mg twice a day, tolerating medication well  Reviewed extensive laboratory evaluations in 2021, negative GM 1 antibody, normal ferritin 95, copper, ANA, CPK, TSH,  Spinal fluid testing July 2021, normal total protein 45, VDRL, negative oligoclonal banding, WBC of 2  Update Oct 19, 2020  She is accompanied by her mother-in-law for repeat EMG nerve conduction study, which showed  widespread chronic neuropathic changes, active process involving left cervical myotomes, definite chronic neuropathic changes involving bilateral lumbosacral myotomes, subtle neuropathic changes even involving left sternocleidomastoid, orbicularis oculi, with her pain is progressive muscle weakness, atrophy, progressive functional status including gait abnormality, even dysphagia, shortness of breath, above findings 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  UPDATE Apr 28 2021: She is more forgetful, misplace things, leave door open,  She also has intermittent increased weakness, needing help to be in bed, more difficulty buttoning,   She denies swallowing difficulty,  Her depression is under good control with celexa 39m daily, She also has episodes of really lightheaded, usually happen in standing station  Update November 23, 2021 SS: here with mother in law, [place] has had a few falls, feels weaker overtime, on a bad day, feels weak, does her best she can. No major changes, improved can do her own showers. Still dysphagia, no worse, careful with certain foods. MMSE 26/30, MOCA 18/30. Feels balance worsening. Recent UTI. Doesn't use a walker. More cognitive decline. Has episodes of light headedness when standing, doesn't want PT. Wonder what stage of memory loss  she is in. She looks good to me today, I do note she repeats herself often.  Review recent labs from PCP  10/31/21, iron panel was low, TSH normal 1.44, CMP unremarkable, Hgb 10.2, occult blood negative.  PHYSICAL EXAMNIATION:  Gen: NAD, conversant, well nourised,well groomed, tends to repeat herself               NEUROLOGICAL EXAM:     04/28/2021   11:00 AM  Montreal Cognitive Assessment   Visuospatial/ Executive (0/5) 1  Naming (0/3) 3  Attention: Read list of digits (0/2) 0  Attention: Read list of letters (0/1) 1  Attention: Serial 7 subtraction starting at 100 (0/3) 0  Language: Repeat phrase (0/2) 0  Language : Fluency (0/1) 1  Abstraction (0/2) 2  Delayed Recall (0/5) 0  Orientation (0/6) 6  Total 14         11/23/2021    2:42 PM 10/19/2020    3:32 PM 09/02/2019    9:32 AM  MMSE - Mini Mental State Exam  Orientation to time '4 5 4  ' Orientation to Place '3 5 4  ' Registration '3 3 3  ' Attention/ Calculation 5 5 0  Recall '3 2 2  ' Language- name 2 objects '2 2 2  ' Language- repeat 1 0 0  Language- follow 3 step command '3 1 3  ' Language- read & follow direction '1 1 1  ' Write a sentence '1 1 1  ' Copy design 0 0 0  Total score '26 25 20   ' CRANIAL NERVES: CN II: Visual fields are full to confrontation.  Pupils are round equal and briskly reactive to light. CN III, IV, VI: extraocular movement are normal. No ptosis. CN V: Facial sensation is intact to pinprick in all 3 divisions bilaterally.  CN VII: Face is symmetric with normal eye closure and smile.   CN XI: Head turning and shoulder shrug are intact  MOTOR: Worsening weakness of the left hand, muscle atrophy noted, 4/5 left upper extremity, weak left hand grip, finger open weakness on the left, 3/5 left hip flexion weakness, mildly left ankle dorsiflexion weakness  REFLEXES: Reflexes are 2+,   3+ at the knees,  SENSORY: Intact to soft touch to all extremities  COORDINATION: Finger-nose-finger and heel-to-shin is normal bilaterally  GAIT/STANCE: Gait is slightly wide-based, cautious, limp on the left, no assistive  device  REVIEW OF SYSTEMS: Full 14 system review of systems performed and notable only for as above  See HPI  ALLERGIES: No Known Allergies  HOME MEDICATIONS: Current Outpatient Medications  Medication Sig Dispense Refill   atorvastatin (LIPITOR) 40 MG tablet Take 40 mg by mouth daily.     Cholecalciferol (VITAMIN D3 PO) Take 1,000 Units by mouth daily.     cinacalcet (SENSIPAR) 30 MG tablet Take 30 mg by mouth 3 (three) times a week.      citalopram (CELEXA) 40 MG tablet Take 40 mg by mouth daily.     labetalol (NORMODYNE) 200 MG tablet Take 400 mg by mouth 2 (two) times daily.     lamoTRIgine (LAMICTAL) 100 MG tablet One in the am, 2 tablets at night 270 tablet 4   memantine (NAMENDA) 10 MG tablet Take 1 tablet (10 mg total) by mouth 2 (two) times daily. 180 tablet 4   valsartan (DIOVAN) 320 MG tablet TAKE 1 TABLET BY MOUTH IN THE MORNING FOR HIGH BLOOD PRESSURE     No current facility-administered medications for this visit.    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   Dizziness due to old head injury    Elevated liver enzymes    Generalized osteoarthritis of multiple sites    GERD (gastroesophageal reflux disease)    Herniated nucleus pulposus, C4-5 left    Hypercalcemia    Hypertension    Hyperthyroidism    Hypoxemic encephalopathy (HCC)    Left hand weakness    Lumbar radiculopathy    Memory loss    Menopausal syndrome    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HCC)    Pharyngeal dysphagia    RLS (restless legs syndrome)    Thyroid  nodule     PAST SURGICAL HISTORY: Past Surgical History:  Procedure Laterality Date   ABDOMINAL HYSTERECTOMY     BREAST LUMPECTOMY WITH RADIOACTIVE SEED LOCALIZATION Left 05/13/2018   Procedure: LEFT BREAST LUMPECTOMY WITH RADIOACTIVE SEED LOCALIZATION;  Surgeon: Jovita Kussmaul, MD;  Location: [facility] OR;  Service: General;  Laterality: Left;   BREAST SURGERY     right benign lump   HIP ARTHROPLASTY     Right   SPLENECTOMY, TOTAL     TONSILLECTOMY     TONSILLECTOMY AND ADENOIDECTOMY     TUBAL LIGATION      FAMILY HISTORY: Family History  Problem Relation Age of Onset   Heart failure Mother    Dementia Mother    Hypertension Mother    Fibromyalgia Mother    Migraines Mother    Lupus Mother    Dementia Father    Hypertension Father    Leukemia Father     SOCIAL HISTORY:   Social History   Socioeconomic History   Marital status: Married    Spouse name: Not on file   Number of children: 2   Years of education: GED   Highest education level: Not on file  Occupational History   Occupation: Diabled  Tobacco Use   Smoking status: Never   Smokeless tobacco: Never  Vaping Use   Vaping Use: Never used  Substance and Sexual Activity   Alcohol use: Not Currently   Drug use: Never   Sexual activity: Not on file  Other Topics Concern   Not on file  Social History Narrative   Lives at home with her husband.   Right-handed.   Caffeine: occasional tea, coke or pepsi zero   Social Determinants of Health   Financial Resource Strain: Not on file  Food Insecurity: Not on file  Transportation Needs: Not on file  Physical Activity: Not on file  Stress: Not on file  Social Connections: Not on file  Intimate Partner Violence: Not on file    Butler Denmark, Laqueta Jean, [facility] [address], [place], [place] [address] [phone]

## 2021-11-23 NOTE — Patient Instructions (Signed)
Recommend against independent driving  Be careful not to fall, make sure you use your walker, stand slowly, drink plenty of water Continue current medications See you back in 6 months

## 2022-02-08 DIAGNOSIS — M533 Sacrococcygeal disorders, not elsewhere classified: Secondary | ICD-10-CM | POA: Insufficient documentation

## 2022-02-20 ENCOUNTER — Telehealth: Payer: Self-pay

## 2022-02-20 NOTE — Telephone Encounter (Signed)
Received clearance form for pt's up coming cataract surgery.  I have placed on Np's desk for review and signature if appropriate.

## 2022-02-23 ENCOUNTER — Ambulatory Visit: Payer: Medicare HMO | Admitting: Neurology

## 2022-04-25 ENCOUNTER — Telehealth: Payer: Self-pay | Admitting: Neurology

## 2022-04-25 NOTE — Telephone Encounter (Signed)
LVM asking pt to call back to reschedule 12/14 appointment -MD out

## 2022-05-23 ENCOUNTER — Ambulatory Visit: Payer: Medicare HMO | Admitting: Neurology

## 2022-05-23 DIAGNOSIS — Z860100 Personal history of colon polyps, unspecified: Secondary | ICD-10-CM | POA: Insufficient documentation

## 2022-05-23 DIAGNOSIS — K56609 Unspecified intestinal obstruction, unspecified as to partial versus complete obstruction: Secondary | ICD-10-CM | POA: Insufficient documentation

## 2022-05-25 ENCOUNTER — Ambulatory Visit: Payer: Medicare HMO | Admitting: Neurology

## 2022-08-01 ENCOUNTER — Ambulatory Visit: Payer: Medicare HMO | Admitting: Neurology

## 2022-08-01 ENCOUNTER — Encounter: Payer: Self-pay | Admitting: Neurology

## 2022-08-01 VITALS — BP 138/77 | HR 66 | Ht 67.0 in | Wt 148.5 lb

## 2022-08-01 DIAGNOSIS — M6281 Muscle weakness (generalized): Secondary | ICD-10-CM | POA: Diagnosis not present

## 2022-08-01 DIAGNOSIS — R29898 Other symptoms and signs involving the musculoskeletal system: Secondary | ICD-10-CM | POA: Diagnosis not present

## 2022-08-01 DIAGNOSIS — R4189 Other symptoms and signs involving cognitive functions and awareness: Secondary | ICD-10-CM

## 2022-08-01 MED ORDER — LAMOTRIGINE 100 MG PO TABS: ORAL_TABLET | ORAL | 4 refills | Status: DC

## 2022-08-01 NOTE — Progress Notes (Signed)
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  Patient complains of painless muscle atrophy, weakness over the past few years now complains of increased bilateral upper extremity, lower extremity weakness, even dysphagia,     EMG nerve conduction study today Oct 20, 2020, showed   progressive widespread neuropathic changes including cervical, bilateral lumbar sacral myotomes, even subtle neuropathic changes involving left sternocleidomastoid, left orbicularis oculi, above findings most suggestive of motor neuron disease, there is also correlate with her examination, hyperreflexia of bilateral upper and lower extremity, in the setting of significant muscle atrophy, weakness  Previous extensive imaging including MRI of the brain, cervical, thoracic, lumbar spine, and laboratory evaluation failed to demonstrate treatable etiology,    History of brain injury, cardiac arrest in 2014 Gradual worsening cognitive impairment  [facility] 17/30  Lab evaluation to rule out for etiology Recurrent spells of dizziness, sometimes to transient loss of consciousness  Possible partial seizure versus orthostatic syncope, improved with lamotrigine treatment  EEG normal in November 2021; EEG in June 2020 showed no significant abnormality  Lamictal level was 5.5 in November 2021  Continue current dose of lamotrigine 100/200 mg  I discussed with patient and her husband about further evaluation, overall her disease progress is slow, patient's her husband both reported there are a lot of family stress, husband is going to have knee surgery, will hold further evaluation for Tammy Mcknight right now, she will reach out to our office for new issues, otherwise continue follow-up with her primary care physician    Data reviewed  MRI of the cervical spine without contrast in June 2020  1.   The spinal cord appears normal. 2.   The central canal is narrowed at C3-C4, C5-C6 and C6-C7 but not enough to be considered spinal  stenosis. 3.   Multilevel degenerative changes as detailed above.  There is no definite nerve root compression, though there is moderate left foraminal narrowing at C3-C4 encroaching upon the left C4 nerve root 4.   No significant changes compared to the 11/11/2015 MRI.   MRI scan of the brain in May 2020, small vessel disease and possible remote age lacunar infarct in the right periventricular white matter.  Overall no significant change compared with previous MRI from 02/25/2018  MRI of thoracic spine in May 2022 showed no significant abnormality.  MRI of lumbar spine in May 2022 multilevel degenerative changes, no significant canal or foraminal narrowing  CT of the chest without contrast.  Trace pericardial effusion, no acute airspace disease,  CSF July 2021: WBC 2, glucose 74, protein 45  Laboratory evaluations in 2021: Normal A1c, lamotrigine 5.5, ANA, TSH, CPK, copper, ferritin, negative anti-GM 1 antibody, lipid profile, CBC, elevated WBC of 13.3, CMP, previously negative HIV, RPR, normal folic acid, C-reactive protein,   HISTORICAL  Tammy Mcknight is a 67 year old female, seen in request by her primary care Dr. Herbie Baltimore Dougfor evaluation of dizziness, unsteady gait,  I have reviewed and summarized the referring note from the referring physician.  Also revealed multiple [facility] chart, patient was able to provide limited detail in her previous injury,  She fell off her porch while feeding the bird in 2014, when she awakened she felt confused, but was able to reach out to her husband to call for help, was initially treated at [facility], later was transferred to [facility], per record, she suffered a ruptured spleen, head injury, she received massive blood transfusion, also reported acute renal failure, per patient, during hospital stay  she also suffered cardiac arrest  She also had a history of right hip replacement in May 2019,  She used to work in home  health aide, 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   I reviewed outside record, MRI of brain in June 2017, right maxillary sinus is opacified with chronic inflammatory changes, there also other evidence of chronic sinusitis, in left maxillary sinus.  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  There was no evidence of acute ischemic changes.   MRI of cervical spine showed degenerative changes, there was no significant canal or foraminal narrowing  UPDATE December 02 2018 She is accompanied by her mother-in-law Tammy Mcknight at today's visit, who provides supplementary history,  Since falling off porch of 10 feet in 2014, patient was noted to have significant memory loss, gradually getting worse, she had abdominal surgery, also had few cardiac arrest need resuscitation during the incident, since then, she has episode of transient dizziness, mild confusion, lasting less than 1 minute, no loss of consciousness, no seizure-like activity noticed, she is having recurrect spells almost daily basis over the past few months,  She also has strong family history of dementia, both father and mother died of dementia in their 72s  She was noted to repeating herself, got lost while driving, and making comments that is not in line with reality,  We have personally reviewed MRI of the brain without contrast May 2020, mild changes of chronic  microvascular ischemia, possible remote lacunar infarction in the right periventricular white matter, overall no change compared to previous MRI from September 2019   She was also noted to have significant atrophy, weakness of left hand, arm numbness involving left hand to left elbow level  UPDATE Mar 25 2019: She is accompanied by her mother-in-law at today's clinic visit, She continues to decline slowly, mild worsening gait abnormality, mental slowing, confusion, difficult to keep up with her medications,  Pulmonary functional test in August 2020 The FVC, FEV1, FEV1/FVC ratio and FEF25-75% are within normal limits. Lung volumes are within normal limits. Following administration of bronchodilators, there is no significant response. The reduced diffusing capacity indicates a minimal loss of functional alveolar capillary surface. However, the diffusing capacity was not corrected for the patient's hemoglobin. 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 Anemia cannot be excluded as a potential cause of the diffusion defect without correcting the observed diffusing capacity for hemoglobin.  EMG nerve conduction study in August showed findings worrisome for motor neuron disease, there is also evidence of moderate right carpal tunnel syndrome.  Laboratory evaluations in June 2020 showed normal or negative RPR, [id number], HIV, folic acid, C-reactive protein, TSH, CPK, ESR, CBC, CMP showed mild elevated glucose 123  I personally reviewed MRI of cervical spine in June 2020: Mild multilevel degenerative changes, there was no significant canal or foraminal stenosis  UPDATE November 24 2019: She is accompanied by her husband at today's clinical visit, there was no significant  change, continue has gait abnormality, muscle atrophy, memory loss  Previous EMG nerve conduction study August 2020 showed evidence of chronic neuropathic changes involving bilateral cervical, lumbar  sacral myotomes, we will repeat EMG nerve conduction study   UPDATE December 31 2019: Patient is accompanied by her husband for electrodiagnostic study today, which continues show evidence of significant chronic neuropathic changes involving left T1, C8, C7 myotomes then the more proximal left cervical myotomes.  There is also subtle evidence of chronic neuropathic changes involving bilateral lower extremities.  There was no significant change compared to previous study in August 2020.  Clinical wise, patient sent her husband continue complains of slow worsening, including gait abnormality, upper extremity muscle weakness, she denies significant sensory loss, she also had gradual worsening mental confusion,  UPDATE Apr 19 2020: She is accompanied today's clinical visit, she was started on prednisone since August 2021, starting from 40 mg daily, tapering down to 20 mg daily, complains of worsening diabetes, no significant improvement in his muscle weakness,  She continue complains of almost daily function spells, feeling weak, has to be helped her husband to get up from low position, go to bed, no loss of consciousness  She also reported normal cardiac monitoring about 2 months ago, that was ordered by her primary care physician. There was no event during 1 week cardiac monitoring  She reported significant improvement for her similar spells in the past when she was started on lamotrigine 100 mg twice a day, tolerating medication well  Reviewed extensive laboratory evaluations in 2021, negative GM 1 antibody, normal ferritin 95, copper, ANA, CPK, TSH,  Spinal fluid testing July 2021, normal total protein 45, VDRL, negative oligoclonal banding, WBC of 2  Update Oct 19, 2020  She is accompanied by her mother-in-law for repeat EMG nerve conduction study, which showed widespread chronic neuropathic changes, active process involving left cervical myotomes, definite chronic neuropathic changes involving  bilateral lumbosacral myotomes, subtle neuropathic changes even involving left sternocleidomastoid, orbicularis oculi, with her pain is progressive muscle weakness, atrophy, progressive functional status including gait abnormality, even dysphagia, shortness of breath, above findings 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  UPDATE Apr 28 2021: She is more forgetful, misplace things, leave door open,  She also has intermittent increased weakness, needing help to be in bed, more difficulty buttoning,   She denies swallowing difficulty,  Her depression is under good control with celexa 93m daily, She also has episodes of really lightheaded, usually happen in standing station  UPDATE Aug 01 2022:   PHYSICAL EXAMNIATION:  Gen: NAD, conversant, well nourised,well groomed, thoughts are scattered                  NEUROLOGICAL EXAM:    08/01/2022    3:59 PM 11/23/2021    3:26 PM 04/28/2021   11:00 AM  Montreal Cognitive Assessment   Visuospatial/ Executive (0/5) 3 2 1  $ Naming (0/3) 2 2 3  $ Attention: Read list of digits (0/2) 0 0 0  Attention: Read list of letters (0/1) 1 1 1  $ Attention:  Serial 7 subtraction starting at 100 (0/3) 0 0 0  Language: Repeat phrase (0/2) 0 1 0  Language : Fluency (0/1) 0 1 1  Abstraction (0/2) 1 2 2  $ Delayed Recall (0/5) 4 3 0  Orientation (0/6) 6 6 6  $ Total 17 18 14      $ CRANIAL NERVES: CN II: Visual fields are full to  confrontation.  Pupils are round equal and briskly reactive to light. CN III, IV, VI: extraocular movement are normal. No ptosis. CN V: Facial sensation is intact to pinprick in all 3 divisions bilaterally.  CN VII: Face is symmetric with normal eye closure and smile.  Mild cheek puff weakness CN XI: Head turning and shoulder shrug are intact  MOTOR: Significant left hand intrinsic muscle atrophy, moderate weakness of bilateral shoulder abduction, elbow flexion, extension, left more than right wrist extension, flexion, grip weakness, . Mild bilateral hip flexion, knee flexion, extension weakness, no significant ankle dorsiflexion weakness,   REFLEXES: Reflexes are brisk at bilateral upper extremity and at the knees, trace at ankles, bilateral plantar responses were extensor  SENSORY: Intact to soft touch to all extremities  COORDINATION: Finger-nose-finger and heel-to-shin is normal bilaterally  GAIT/STANCE: Gait is slightly wide-based, cautious, stiff, unsteady,  REVIEW OF SYSTEMS: Full 14 system review of systems performed and notable only for as above  See HPI  ALLERGIES: No Known Allergies  HOME MEDICATIONS: Current Outpatient Medications  Medication Sig Dispense Refill   atorvastatin (LIPITOR) 40 MG tablet Take 40 mg by mouth daily.     Cholecalciferol (VITAMIN D3 PO) Take 1,000 Units by mouth daily.     cinacalcet (SENSIPAR) 30 MG tablet Take 30 mg by mouth 3 (three) times a week.      citalopram (CELEXA) 40 MG tablet Take 40 mg by mouth daily.     labetalol (NORMODYNE) 200 MG tablet Take 400 mg by mouth 2 (two) times daily.     lamoTRIgine (LAMICTAL) 100 MG tablet One in the am, 2 tablets at night 270 tablet 4   memantine (NAMENDA) 10 MG tablet Take 1 tablet (10 mg total) by mouth 2 (two) times daily. 180 tablet 4   valsartan (DIOVAN) 320 MG tablet TAKE 1 TABLET BY MOUTH IN THE MORNING FOR HIGH BLOOD PRESSURE     No current facility-administered medications for this  visit.    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   Dizziness due to old head injury    Elevated liver enzymes    Generalized osteoarthritis of multiple sites    GERD (gastroesophageal reflux disease)    Herniated nucleus pulposus, C4-5 left    Hypercalcemia    Hypertension    Hyperthyroidism    Hypoxemic encephalopathy (HCC)    Left hand weakness    Lumbar radiculopathy    Memory loss    Menopausal syndrome    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[place])  Pharyngeal dysphagia    RLS (restless legs syndrome)    Thyroid nodule     PAST SURGICAL HISTORY: Past Surgical History:  Procedure Laterality Date   ABDOMINAL HYSTERECTOMY     BREAST LUMPECTOMY WITH RADIOACTIVE SEED LOCALIZATION Left 05/13/2018   Procedure: LEFT BREAST LUMPECTOMY WITH RADIOACTIVE SEED LOCALIZATION;  Surgeon: Jovita Kussmaul, MD;  Location: [facility] OR;  Service: General;  Laterality: Left;   BREAST SURGERY     right benign lump   HIP ARTHROPLASTY     Right   SPLENECTOMY, TOTAL     TONSILLECTOMY     TONSILLECTOMY AND ADENOIDECTOMY     TUBAL LIGATION      FAMILY HISTORY: Family History  Problem Relation Age of Onset   Heart failure Mother    Dementia Mother    Hypertension Mother    Fibromyalgia Mother    Migraines Mother    Lupus Mother    Dementia Father    Hypertension Father    Leukemia Father     SOCIAL HISTORY:   Social History   Socioeconomic History   Marital status: Married    Spouse name: Not on file   Number of children: 2   Years of education: GED    Highest education level: Not on file  Occupational History   Occupation: Diabled  Tobacco Use   Smoking status: Never   Smokeless tobacco: Never  Vaping Use   Vaping Use: Never used  Substance and Sexual Activity   Alcohol use: Not Currently   Drug use: Never   Sexual activity: Not on file  Other Topics Concern   Not on file  Social History Narrative   Lives at home with her husband.   Right-handed.   Caffeine: occasional tea, coke or pepsi zero   Social Determinants of Health   Financial Resource Strain: Not on file  Food Insecurity: Not on file  Transportation Needs: Not on file  Physical Activity: Not on file  Stress: Not on file  Social Connections: Not on file  Intimate Partner Violence: Not on file     Marcial Pacas, M.D. Ph.D.  [facility] [place], [place] [address] Phone: [phone] Fax:      [phone]

## 2022-08-02 LAB — TSH: TSH: 1.86 u[IU]/mL (ref 0.450–4.500)

## 2022-08-02 LAB — CK: Total CK: 117 U/L (ref 32–182)

## 2022-08-02 LAB — RPR: RPR Ser Ql: NONREACTIVE

## 2022-08-02 LAB — VITAMIN B12: Vitamin B-12: 721 pg/mL (ref 232–1245)

## 2022-08-02 LAB — HGB A1C W/O EAG: Hgb A1c MFr Bld: 6.1 % — ABNORMAL HIGH (ref 4.8–5.6)

## 2022-08-08 ENCOUNTER — Telehealth: Payer: Self-pay | Admitting: *Deleted

## 2022-08-08 NOTE — Telephone Encounter (Signed)
Called pt & LVM asking for call back. Left office number and hours in message.  When the patient calls back, please tell her that Dr Krista Blue said her laboratory evaluation showed  --- Mild elevated A1c at 6.1, improved compared to 2 years ago --- Otherwise there was no significant abnormalities.

## 2022-08-09 NOTE — Telephone Encounter (Signed)
Pt called back and would like to discuss with RN.

## 2022-08-09 NOTE — Telephone Encounter (Signed)
Pt is asking for a call back from RN

## 2022-08-09 NOTE — Telephone Encounter (Signed)
Called and LVM rq CB

## 2022-08-16 ENCOUNTER — Ambulatory Visit (INDEPENDENT_AMBULATORY_CARE_PROVIDER_SITE_OTHER): Payer: Medicare HMO | Admitting: Neurology

## 2022-08-16 DIAGNOSIS — R4182 Altered mental status, unspecified: Secondary | ICD-10-CM

## 2022-08-16 DIAGNOSIS — R29898 Other symptoms and signs involving the musculoskeletal system: Secondary | ICD-10-CM

## 2022-08-16 DIAGNOSIS — R4189 Other symptoms and signs involving cognitive functions and awareness: Secondary | ICD-10-CM

## 2022-08-16 DIAGNOSIS — M6281 Muscle weakness (generalized): Secondary | ICD-10-CM

## 2022-08-22 NOTE — Telephone Encounter (Signed)
At 4:24pm on 08-09-22 the message: that Dr Krista Blue did not didn't see anything concerning on lab work was relayed to pt, she did not have any questions, nor did she request a call back.

## 2022-08-24 ENCOUNTER — Telehealth: Payer: Self-pay | Admitting: Neurology

## 2022-08-24 NOTE — Telephone Encounter (Signed)
Pt called to follow-up on EEG results.

## 2022-08-28 NOTE — Procedures (Signed)
   HISTORY: 67 year old female with history of an anoxic brain injury, presenting with progressive worsening memory loss, confusion episode  TECHNIQUE:  This is a routine 16 channel EEG recording with one channel devoted to a limited EKG recording.  It was performed during wakefulness, drowsiness and asleep.  Hyperventilation and photic stimulation were performed as activating procedures.  There are open eye blinking artifact  Upon maximum arousal, posterior dominant waking rhythm consistent of low amplitude rhythmic alpha range activity. Activities are symmetric over the bilateral posterior derivations and attenuated with eye opening.  Hyperventilation produced mild/moderate buildup with higher amplitude and the slower activities noted.  Photic stimulation did not alter the tracing.  During EEG recording, patient developed drowsiness and no deeper stage of sleep was achieved  During EEG recording, there was no epileptiform discharge noted.  EKG demonstrate normal sinus rhythm.  CONCLUSION: This is a  normal awake EEG.  There is no electrodiagnostic evidence of epileptiform discharge.  Marcial Pacas, M.D. Ph.D.  [facility] [place], [place] [address] Phone: [phone] Fax:      [phone]

## 2022-08-28 NOTE — Telephone Encounter (Signed)
I have forwarded to Dr. Krista Blue for review, please relay to patient that we will reach out once we have results to share.

## 2022-08-30 ENCOUNTER — Telehealth: Payer: Self-pay

## 2022-08-30 NOTE — Telephone Encounter (Signed)
Pt called and was informed that the EEG was Normal. Pt verbalized appreciation.

## 2022-08-30 NOTE — Telephone Encounter (Signed)
Please let patient know EEG was normal

## 2022-08-30 NOTE — Telephone Encounter (Signed)
Attempted to call pt, LVM for normal. Ask pt to call back for questions or concerns.

## 2022-08-30 NOTE — Telephone Encounter (Signed)
-----   Message from Marcial Pacas, MD sent at 08/30/2022  9:52 AM EDT ----- Please call patient, EEG showed no significant abnormalities.

## 2022-09-28 DIAGNOSIS — M810 Age-related osteoporosis without current pathological fracture: Secondary | ICD-10-CM | POA: Insufficient documentation

## 2022-10-30 DIAGNOSIS — L918 Other hypertrophic disorders of the skin: Secondary | ICD-10-CM | POA: Insufficient documentation

## 2023-01-31 DIAGNOSIS — B9789 Other viral agents as the cause of diseases classified elsewhere: Secondary | ICD-10-CM | POA: Insufficient documentation

## 2023-02-19 ENCOUNTER — Other Ambulatory Visit: Payer: Self-pay | Admitting: Neurology

## 2023-03-13 ENCOUNTER — Encounter: Payer: Self-pay | Admitting: Neurology

## 2023-03-13 ENCOUNTER — Ambulatory Visit: Payer: Medicare HMO | Admitting: Neurology

## 2023-03-13 VITALS — BP 142/88 | Resp 16 | Ht 67.0 in | Wt 149.5 lb

## 2023-03-13 DIAGNOSIS — M62532 Muscle wasting and atrophy, not elsewhere classified, left forearm: Secondary | ICD-10-CM | POA: Diagnosis not present

## 2023-03-13 DIAGNOSIS — M6281 Muscle weakness (generalized): Secondary | ICD-10-CM

## 2023-03-13 DIAGNOSIS — R410 Disorientation, unspecified: Secondary | ICD-10-CM | POA: Diagnosis not present

## 2023-03-13 DIAGNOSIS — R4189 Other symptoms and signs involving cognitive functions and awareness: Secondary | ICD-10-CM | POA: Diagnosis not present

## 2023-03-13 MED ORDER — LAMOTRIGINE 100 MG PO TABS: 100.0000 mg | ORAL_TABLET | Freq: Two times a day (BID) | ORAL | 3 refills | Status: DC

## 2023-03-13 NOTE — Progress Notes (Signed)
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  Patient complains of painless muscle atrophy, weakness over the past few years now complains of increased bilateral upper extremity, lower extremity weakness, even dysphagia,   But her clinical course is relatively stable, there was no significant worsening by continued observation over the past couple years,    EMG nerve conduction study today Oct 20, 2020, showed   progressive widespread neuropathic changes including cervical, bilateral lumbar sacral myotomes, even subtle neuropathic changes involving left sternocleidomastoid, left orbicularis oculi, above findings raise the possibility of motor neuron disease, there is also correlate with her examination, hyperreflexia of bilateral upper and lower extremity, in the setting of significant muscle atrophy, weakness  Well-preserved sensory response, significant motor abnormality, major weakness is at the left upper extremity, 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,  Previous extensive imaging including MRI of the brain, cervical, thoracic, lumbar spine, and laboratory evaluation failed to demonstrate treatable etiology,   Anti-GM1 antibody was negative in July 2021   Suggested repeat EMG nerve conduction study   History of brain injury, cardiac arrest in 2014 Gradual worsening cognitive impairment  MOCA 21/30  Recurrent spells of dizziness, sometimes to transient loss of consciousness  EEG showed no significant abnormality, lamotrigine up to 300 mg daily provide no difference in her spells, will decrease to 100 mg twice a day,  Differentiation diagnoses remain syncope/presyncope versus partial seizure  DATA: I have personally reviewed MRI findings, laboratory result, referring note: MRI of the cervical spine without contrast in June 2020  1.   The spinal cord appears normal. 2.   The central canal is narrowed at C3-C4, C5-C6 and C6-C7 but not enough to be  considered spinal stenosis. 3.   Multilevel degenerative changes as detailed above.  There is no definite nerve root compression, though there is moderate left foraminal narrowing at C3-C4 encroaching upon the left C4 nerve root 4.   No significant changes compared to the 11/11/2015 MRI.   MRI scan of the brain in May 2020, small vessel disease and possible remote age lacunar infarct in the right periventricular white matter.  Overall no significant change compared with previous MRI from 02/25/2018  MRI of thoracic spine in May 2022 showed no significant abnormality.  MRI of lumbar spine in May 2022 multilevel degenerative changes, no significant canal or foraminal narrowing  CT of the chest without contrast.  Trace pericardial effusion, no acute airspace disease,  CSF July 2021: WBC 2, glucose 74, protein 45  Laboratory evaluations in 2021: Normal A1c, lamotrigine 5.5, ANA, TSH, CPK, copper, ferritin, negative anti-GM 1 antibody, lipid profile, CBC, elevated WBC of 13.3, CMP, previously negative HIV, RPR, normal folic acid, C-reactive protein,   HISTORICAL  Tammy Mcknight is a 67 year old female, seen in request by her primary care Dr. Molly Maduro Mcknight evaluation of dizziness, unsteady gait,  I have reviewed and summarized the referring note from the referring physician.  Also revealed multiple [facility] chart, patient was able to provide limited detail in her previous injury,  She fell off her porch while feeding the bird in 2014, when she awakened she felt confused, but was able to reach out to her husband to call for help, was initially treated at [facility], later was transferred to [facility], per record, she suffered a ruptured spleen, head injury, she received massive blood transfusion, also reported acute renal failure, per patient, during hospital stay she also suffered cardiac arrest  She also  had a history of right hip replacement in May 2019,  She used  to work in home health aide, 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   I reviewed outside record, MRI of brain in June 2017, right maxillary sinus is opacified with chronic inflammatory changes, there also other evidence of chronic sinusitis, in left maxillary sinus.  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  There was no evidence of acute ischemic changes.   MRI of cervical spine showed degenerative changes, there was no significant canal or foraminal narrowing  UPDATE December 02 2018 She is accompanied by her mother-in-law Tammy Mcknight at today's visit, who provides supplementary history,  Since falling off porch of 10 feet in 2014, patient was noted to have significant memory loss, gradually getting worse, she had abdominal surgery, also had few cardiac arrest need resuscitation during the incident, since then, she has episode of transient dizziness, mild confusion, lasting less than 1 minute, no loss of consciousness, no seizure-like activity noticed, she is having recurrect spells almost daily basis over the past few months,  She also has strong family history of dementia, both father and mother died of dementia in their 30s  She was noted to repeating herself, got lost while driving, and making comments that is not in line with reality,  We have personally reviewed MRI of the brain without contrast May 2020, mild  changes of chronic microvascular ischemia, possible remote lacunar infarction in the right periventricular white matter, overall no change compared to previous MRI from September 2019   She was also noted to have significant atrophy, weakness of left hand, arm numbness involving left hand to left elbow level  UPDATE Mar 25 2019: She is accompanied by her mother-in-law at today's clinic visit, She continues to decline slowly, mild worsening gait abnormality, mental slowing, confusion, difficult to keep up with her medications,  Pulmonary functional test in August 2020 The FVC, FEV1, FEV1/FVC ratio and FEF25-75% are within normal limits. Lung volumes are within normal limits. Following administration of bronchodilators, there is no significant response. The reduced diffusing capacity indicates a minimal loss of functional alveolar capillary surface. However, the diffusing capacity was not corrected for the patient's hemoglobin. 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 Anemia cannot be excluded as a potential cause of the diffusion defect without correcting the observed diffusing capacity for hemoglobin.  EMG nerve conduction study in August showed findings worrisome for motor neuron disease, there is also evidence of moderate right carpal tunnel syndrome.  Laboratory evaluations in June 2020 showed normal or negative RPR, B12, HIV, folic acid, C-reactive protein, TSH, CPK, ESR, CBC, CMP showed mild elevated glucose 123  I personally reviewed MRI of cervical spine in June 2020: Mild multilevel degenerative changes, there was no significant canal or foraminal stenosis  UPDATE November 24 2019: She is accompanied by her husband at today's clinical visit, there was no significant change, continue has gait abnormality, muscle atrophy, memory  loss  Previous EMG nerve conduction study August 2020 showed evidence of chronic neuropathic changes involving bilateral  cervical, lumbar sacral myotomes, we will repeat EMG nerve conduction study   UPDATE December 31 2019: Patient is accompanied by her husband for electrodiagnostic study today, which continues show evidence of significant chronic neuropathic changes involving left T1, C8, C7 myotomes then the more proximal left cervical myotomes.  There is also subtle evidence of chronic neuropathic changes involving bilateral lower extremities.  There was no significant change compared to previous study in August 2020.  Clinical wise, patient sent her husband continue complains of slow worsening, including gait abnormality, upper extremity muscle weakness, she denies significant sensory loss, she also had gradual worsening mental confusion,  UPDATE Apr 19 2020: She is accompanied today's clinical visit, she was started on prednisone since August 2021, starting from 40 mg daily, tapering down to 20 mg daily, complains of worsening diabetes, no significant improvement in his muscle weakness,  She continue complains of almost daily function spells, feeling weak, has to be helped her husband to get up from low position, go to bed, no loss of consciousness  She also reported normal cardiac monitoring about 2 months ago, that was ordered by her primary care physician. There was no event during 1 week cardiac monitoring  She reported significant improvement for her similar spells in the past when she was started on lamotrigine 100 mg twice a day, tolerating medication well  Reviewed extensive laboratory evaluations in 2021, negative GM 1 antibody, normal ferritin 95, copper, ANA, CPK, TSH,  Spinal fluid testing July 2021, normal total protein 45, VDRL, negative oligoclonal banding, WBC of 2  Update Oct 19, 2020  She is accompanied by her mother-in-law for repeat EMG nerve conduction study, which showed widespread chronic neuropathic changes, active process involving left cervical myotomes, definite chronic neuropathic  changes involving bilateral lumbosacral myotomes, subtle neuropathic changes even involving left sternocleidomastoid, orbicularis oculi, with her pain is progressive muscle weakness, atrophy, progressive functional status including gait abnormality, even dysphagia, shortness of breath, above findings 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  UPDATE Apr 28 2021: She is more forgetful, misplace things, leave door open,  She also has intermittent increased weakness, needing help to be in bed, more difficulty buttoning,   She denies swallowing difficulty,  Her depression is under good control with celexa 40mg  daily, She also has episodes of really lightheaded, usually happen in standing station  UPDATE Mar 13 2023 She is accompanied by her husband at today's clinical visit, overall stable, continue having fainting spells, described 1 episode about 2 weeks ago, she was in the shower, getting so weak, whole body went limp, no loss of consciousness, lasting for few minutes, no seizure-like activity, has been reported lamotrigine up to 300 mg daily made no difference in her above spells, which happened intermittently, once every few weeks or months  She continues to have significant memory loss, easily get confused, repeating herself, spent most of the time playing on her tablet  Worsening left hand and arm weakness mild  steady gait,   PHYSICAL EXAMNIATION:  Gen: NAD,  conversant, well nourised,well groomed, thoughts are scattered                  NEUROLOGICAL EXAM:    03/13/2023    3:04 PM 08/01/2022    3:59 PM 11/23/2021    3:26 PM 04/28/2021   11:00 AM  Montreal Cognitive Assessment   Visuospatial/ Executive (0/5) 1 3 2 1   Naming (0/3) 3 2 2 3   Attention: Read list of digits (0/2) 0 0 0 0  Attention: Read list of letters (0/1) 1 1 1 1   Attention: Serial 7 subtraction starting at 100 (0/3) 3 0 0 0  Language: Repeat phrase (0/2) 0 0 1 0  Language : Fluency (0/1) 1 0 1 1  Abstraction (0/2) 2 1 2 2   Delayed Recall (0/5) 4 4 3  0  Orientation (0/6) 6 6 6 6   Total 21 17 18 14       CRANIAL NERVES: CN II: Visual fields are full to confrontation.  Pupils are round equal and briskly reactive to light. CN III, IV, VI: extraocular movement are normal. No ptosis. CN V: Facial sensation is intact to pinprick in all 3 divisions bilaterally.  CN VII: Face is symmetric with normal eye closure and smile.  Mild cheek puff weakness CN XI: Head turning and shoulder shrug are intact  MOTOR: Significant left hand intrinsic muscle and left forearm atrophy weakness, left wrist flexion 3, extension 4 -, finger abduction 3,  Mild bilateral hip flexion, knee flexion, extension weakness, no significant ankle dorsiflexion weakness,   REFLEXES: Reflexes are brisk at bilateral upper extremity and at the knees, trace at ankles, bilateral plantar responses were extensor  SENSORY: Intact to soft touch to all extremities  COORDINATION: Finger-nose-finger and heel-to-shin is normal bilaterally  GAIT/STANCE: Gait is slightly wide-based, cautious, stiff, unsteady,  REVIEW OF SYSTEMS: Full 14 system review of systems performed and notable only for as above  See HPI  ALLERGIES: No Known Allergies  HOME MEDICATIONS: Current Outpatient Medications  Medication Sig Dispense Refill   atorvastatin (LIPITOR) 40 MG tablet Take 40 mg by mouth daily.     Cholecalciferol  (VITAMIN D3 PO) Take 1,000 Units by mouth daily.     cinacalcet (SENSIPAR) 30 MG tablet Take 30 mg by mouth 3 (three) times a week.      citalopram (CELEXA) 40 MG tablet Take 40 mg by mouth daily.     labetalol (NORMODYNE) 200 MG tablet Take 400 mg by mouth 2 (two) times daily.     lamoTRIgine (LAMICTAL) 100 MG tablet TAKE 1 TABLET BY MOUTH IN THE MORNING AND 2 AT NIGHT 270 tablet 0   memantine (NAMENDA) 10 MG tablet Take 1 tablet by mouth twice daily 180 tablet 0   valsartan (DIOVAN) 320 MG tablet TAKE 1 TABLET BY MOUTH IN THE MORNING FOR HIGH BLOOD PRESSURE     No current facility-administered medications for this visit.    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   Dizziness due to old head injury    Elevated liver enzymes    Generalized osteoarthritis of multiple sites    GERD (gastroesophageal reflux disease)    Herniated nucleus pulposus, C4-5 left    Hypercalcemia    Hypertension    Hyperthyroidism    Hypoxemic encephalopathy (HCC)    Left hand weakness    Lumbar radiculopathy    Memory loss    Menopausal syndrome    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HCC)    Pharyngeal dysphagia    RLS (restless legs syndrome)    Thyroid nodule     PAST SURGICAL HISTORY: Past Surgical History:  Procedure Laterality Date   ABDOMINAL HYSTERECTOMY     BREAST LUMPECTOMY WITH RADIOACTIVE SEED LOCALIZATION Left 05/13/2018   Procedure: LEFT BREAST LUMPECTOMY WITH RADIOACTIVE SEED LOCALIZATION;  Surgeon: Griselda Miner, MD;  Location: [facility] OR;  Service: General;   Laterality: Left;   BREAST SURGERY     right benign lump   HIP ARTHROPLASTY     Right   SPLENECTOMY, TOTAL     TONSILLECTOMY     TONSILLECTOMY AND ADENOIDECTOMY     TUBAL LIGATION      FAMILY HISTORY: Family History  Problem Relation Age of Onset   Heart failure Mother    Dementia Mother    Hypertension Mother    Fibromyalgia Mother    Migraines Mother    Lupus Mother    Dementia Father    Hypertension Father    Leukemia Father     SOCIAL HISTORY:   Social History   Socioeconomic History   Marital status: Married    Spouse name: Not on file   Number of children: 2   Years of education: GED   Highest education level: Not on file  Occupational History   Occupation: Diabled  Tobacco Use   Smoking status: Never   Smokeless tobacco: Never  Vaping Use   Vaping status: Never Used  Substance and Sexual Activity   Alcohol use: Not Currently   Drug use: Never   Sexual activity: Not on file  Other Topics Concern   Not on file  Social History Narrative   Lives at home with her husband.   Right-handed.   Caffeine: occasional tea, coke or pepsi zero   Social Determinants of Health   Financial Resource Strain: Not on file  Food Insecurity: Not on file  Transportation Needs: Not on file  Physical Activity: Not on file  Stress: Not on file  Social Connections: Not on file  Intimate Partner Violence: Not on file     Levert Feinstein, M.D. Ph.D.  [facility] [address] [place], [place] [address] Phone: [phone]

## 2023-04-09 ENCOUNTER — Ambulatory Visit: Payer: Medicare HMO | Admitting: Neurology

## 2023-05-23 ENCOUNTER — Encounter: Payer: Medicare HMO | Admitting: Neurology

## 2023-05-28 ENCOUNTER — Telehealth: Payer: Self-pay | Admitting: Neurology

## 2023-05-28 NOTE — Telephone Encounter (Signed)
Pt called to confirm appt time. 

## 2023-05-30 ENCOUNTER — Ambulatory Visit (INDEPENDENT_AMBULATORY_CARE_PROVIDER_SITE_OTHER): Payer: Medicare HMO | Admitting: Neurology

## 2023-05-30 ENCOUNTER — Ambulatory Visit (INDEPENDENT_AMBULATORY_CARE_PROVIDER_SITE_OTHER): Payer: Self-pay | Admitting: Neurology

## 2023-05-30 DIAGNOSIS — M6281 Muscle weakness (generalized): Secondary | ICD-10-CM

## 2023-05-30 DIAGNOSIS — M62532 Muscle wasting and atrophy, not elsewhere classified, left forearm: Secondary | ICD-10-CM

## 2023-05-30 DIAGNOSIS — R410 Disorientation, unspecified: Secondary | ICD-10-CM

## 2023-05-30 DIAGNOSIS — Z0289 Encounter for other administrative examinations: Secondary | ICD-10-CM

## 2023-05-30 DIAGNOSIS — G6182 Multifocal motor neuropathy: Secondary | ICD-10-CM

## 2023-05-30 DIAGNOSIS — R4189 Other symptoms and signs involving cognitive functions and awareness: Secondary | ICD-10-CM

## 2023-05-30 NOTE — Procedures (Signed)
Full Name: Tammy Mcknight Gender: Female MRN #: [id number] Date of Birth: Nov 26, 1955    Visit Date: 05/30/2023 14:22 Age: 67 Years Examining Physician: Dr. Levert Feinstein Referring Physician: Dr. Levert Feinstein Height: 5 feet 7 inch History: 67 year old female presenting with painless muscle atrophy, weakness, mainly involving left upper extremity, slow progressive over the past few years  Summary of the test:  Nerve conduction study: Right sural, superficial peroneal sensory responses were within normal limit.  Bilateral ulnar sensory responses were normal.  Bilateral median sensory responses showed mildly prolonged peak latency with normal snap amplitude, right worse than left  Right peroneal to EDB, tibial, right median and ulnar motor responses were normal.  Left ulnar motor responses showed significantly decreased CMAP amplitude.  Left median motor responses showed moderately decreased CMAP amplitude.  Electromyography: Selected needle examinations of bilateral upper extremity muscles, right lower extremity muscles, cervical paraspinal, right lumbosacral paraspinal, left orbicularis oculi was performed  There was evidence of active denervation involving left upper extremity muscles, involving left C5-T1 myotomes, most noticeable at left C7-T1 myotomes.   There was subtle mild chronic neuropathic changes involving right C5-T1 myotomes.  There was no spontaneous activity at bilateral cervical paraspinal muscles.  There is evidence of chronic neuropathic changes involving right L4 L5-S1 myotomes.  There is no evidence of active denervation in right lumbosacral paraspinal muscles.  Needle examination of left orbicularis oculi showed no significant abnormality.  Conclusion: This is an abnormal study.  There is evidence of widespread chronic neuropathic changes involving bilateral cervical, right lumbosacral levels, most noticeable at left cervical myotomes with well-preserved sensory  component.  Differentiation diagnosis including slow progressive motor neuron disease versus multifocal motor neuropathy.    Levert Feinstein, M.D. Ph.D.  [facility] [address] [place], [place] [address] Phone: [phone] Fax:      [phone]   [facility] [address] [place], [place] [address] Tel: [phone] Fax: [phone]  Verbal informed consent was obtained from the patient, patient was informed of potential risk of procedure, including bruising, bleeding, hematoma formation, infection, muscle weakness, muscle pain, numbness, among others.        MNC    Nerve / Sites Muscle Latency Ref. Amplitude Ref. Rel Amp Segments Distance Velocity Ref. Area    ms ms mV mV %  cm m/s m/s mVms  R Median - APB     Wrist APB 4.2 <=4.4 5.5 >=4.0 100 Wrist - APB 7   17.8     Upper arm APB 8.7  6.2  113 Upper arm - Wrist 25 56 >=49 20.4  L Median - APB     Wrist APB 3.8 <=4.4 1.3 >=4.0 100 Wrist - APB 7   2.8     Upper arm APB 9.4  1.3  98.8 Upper arm - Wrist 25 45 >=49 2.8  R Ulnar - ADM     Wrist ADM 3.0 <=3.3 7.9 >=6.0 100 Wrist - ADM 7   21.9     B.Elbow ADM 4.4  6.9  88 B.Elbow - Wrist 10 68 >=49 19.5     A.Elbow ADM 7.4  6.3  91.5 A.Elbow - B.Elbow 17 57 >=49 18.6  L Ulnar - ADM     Wrist ADM 4.5 <=3.3 0.3 >=6.0 100 Wrist - ADM 7   0.3     B.Elbow ADM 6.6  0.3  102 B.Elbow - Wrist 10 49 >=49 0.3     A.Elbow ADM  9.7  0.3  107 A.Elbow - B.Elbow 18 57 >=49 0.3  R Peroneal - EDB     Ankle EDB 5.4 <=6.5 3.4 >=2.0 100 Ankle - EDB 9   9.0     Fib head EDB 12.9  2.8  84 Fib head - Ankle 31 41 >=44 8.5     Pop fossa EDB 14.6  3.2  112 Pop fossa - Fib head 8 48 >=44 9.2         Pop fossa - Ankle      R Tibial - AH     Ankle AH 4.8 <=5.8 5.6 >=4.0 100 Ankle - AH 9   13.3     Pop fossa AH 15.2  4.3  77.7 Pop fossa - Ankle 40 38 >=41 11.9                 SNC    Nerve / Sites Rec. Site Peak Lat Ref.  Amp Ref. Segments Distance    ms ms V V   cm  R Sural - Ankle (Calf)     Calf Ankle 3.9 <=4.4 5 >=6 Calf - Ankle 14  R Superficial peroneal - Ankle     Lat leg Ankle 4.4 <=4.4 9 >=6 Lat leg - Ankle 14  R Median - Orthodromic (Dig II, Mid palm)     Dig II Wrist 4.3 <=3.4 12 >=10 Dig II - Wrist 13  L Median - Orthodromic (Dig II, Mid palm)     Dig II Wrist 3.8 <=3.4 16 >=10 Dig II - Wrist 13  R Ulnar - Orthodromic, (Dig V, Mid palm)     Dig V Wrist 3.1 <=3.1 5 >=5 Dig V - Wrist 11  L Ulnar - Orthodromic, (Dig V, Mid palm)     Dig V Wrist 3.1 <=3.1 6 >=5 Dig V - Wrist 52                 F  Wave    Nerve F Lat Ref.   ms ms  R Ulnar - ADM 24.7 <=32.0  L Ulnar - ADM 31.2 <=32.0  R Tibial - AH 60.9 <=56.0           EMG Summary Table    Spontaneous MUAP Recruitment  Muscle IA Fib PSW Fasc Other Amp Dur. Poly Pattern  L. First dorsal interosseous Increased 1+ None None _______ Increased Increased 1+ Reduced  L. Pronator teres Normal None None None _______ Increased Increased 1+ Reduced  L. Extensor digitorum communis Normal None None None _______ Increased Increased 1+ Reduced  L. Flexor digitorum profundus (Ulnar) Increased None None None _______ Increased Increased 1+ Reduced  L. Biceps brachii Normal None None None _______ Increased Increased 1+ Reduced  L. Deltoid Normal None None None _______ Increased Increased 1+ Reduced  L. Triceps brachii Normal None None None _______ Increased Increased 1+ Reduced  L. Cervical paraspinals Normal None None None _______ Normal Normal Normal Normal  R. First dorsal interosseous Increased None None None _______ Increased Increased 1+ Reduced  R. Pronator teres Normal None None None _______ Normal Normal Normal Reduced  R. Biceps brachii Normal None None None _______ Normal Normal Normal Reduced  R. Deltoid Normal None None None _______ Normal Normal Normal Reduced  R. Triceps brachii Normal None None None _______ Normal Normal Normal Reduced  R. Tibialis anterior Normal None None None  _______ Normal Normal Normal Reduced  R. Tibialis posterior Normal None None None _______ Normal Normal Normal Reduced  R. Gastrocnemius (Medial head) Normal None None None _______ Normal Normal Normal Reduced  R. Peroneus longus Normal None None None _______ Normal Normal Normal Reduced  R. Vastus lateralis Normal None None None _______ Normal Normal Normal Reduced  L. Orbicularis oculi Normal None None None _______ Normal Normal Normal Normal

## 2023-05-30 NOTE — Progress Notes (Signed)
Assessment and plan: Progressive painless muscle weakness, atrophy,  Patient complains of painless muscle atrophy, weakness over the past few years now complains of increased bilateral upper extremity, lower extremity weakness      EMG nerve conduction study today Oct 20, 2020, showed chronic widespread neuropathic changes including cervical, bilateral lumbar sacral myotomes, even subtle neuropathic changes involving left sternocleidomastoid, left orbicularis oculi, above findings raise the possibility of motor neuron disease, there is also correlate with her examination, hyperreflexia of bilateral upper and lower extremity, in the setting of significant muscle atrophy, weakness  Well-preserved sensory response, significant motor abnormality, major weakness is at the left upper extremity, 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,  Previous extensive imaging including MRI of the brain, cervical, thoracic, lumbar spine, and laboratory evaluation failed to demonstrate treatable etiology,   Anti-GM1 antibody was negative in July 2021   Repeat EMG nerve conduction study May 30, 2023 continue to demonstrate chronic neuropathic changes involving bilateral cervical, right lumbosacral myotomes with well-preserved sensory component, she did has significant disability from her muscle atrophy, weakness, discussed with patient and her husband, decided a trial of IVIG, prior authorization 2 g/kg loading dose followed by 1 g/kg every 3 weeks     History of brain injury, cardiac arrest in 2014 Gradual worsening cognitive impairment  MOCA 21/30  Recurrent spells of dizziness, sometimes to transient loss of consciousness  EEG showed no significant abnormality, lamotrigine up to 300 mg daily provide no difference in her spells, will decrease to 100 mg twice a day,  Differentiation diagnoses remain syncope/presyncope versus partial seizure  DATA: I have personally reviewed MRI  findings, laboratory result, referring note: MRI of the cervical spine without contrast in June 2020  1.   The spinal cord appears normal. 2.   The central canal is narrowed at C3-C4, C5-C6 and C6-C7 but not enough to be considered spinal stenosis. 3.   Multilevel degenerative changes as detailed above.  There is no definite nerve root compression, though there is moderate left foraminal narrowing at C3-C4 encroaching upon the left C4 nerve root 4.   No significant changes compared to the 11/11/2015 MRI.   MRI scan of the brain in May 2020, small vessel disease and possible remote age lacunar infarct in the right periventricular white matter.  Overall no significant change compared with previous MRI from 02/25/2018  MRI of thoracic spine in May 2022 showed no significant abnormality.  MRI of lumbar spine in May 2022 multilevel degenerative changes, no significant canal or foraminal narrowing  CT of the chest without contrast.  Trace pericardial effusion, no acute airspace disease,  CSF July 2021: WBC 2, glucose 74, protein 45  Laboratory evaluations in 2021: Normal A1c, lamotrigine 5.5, ANA, TSH, CPK, copper, ferritin, negative anti-GM 1 antibody, lipid profile, CBC, elevated WBC of 13.3, CMP, previously negative HIV, RPR, normal folic acid, C-reactive protein,   HISTORICAL  Tammy Mcknight is a 67 year old female, seen in request by her primary care Dr. Molly Maduro Mcknight evaluation of dizziness, unsteady gait,  I have reviewed and summarized the referring note from the referring physician.  [facility], patient was able to provide limited detail in her previous injury,  She fell off her porch while feeding the bird in 2014, when she awakened she felt confused, but was able to reach out to her husband to call for help, was initially treated at [facility], later was transferred to [facility], per record,  she suffered a ruptured spleen, head  injury, she received massive blood transfusion, also reported acute renal failure, per patient, during hospital stay she also suffered cardiac arrest  She also had a history of right hip replacement in May 2019,  She used to work in home health aide, 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   I reviewed outside record, MRI of brain in June 2017, right maxillary sinus is opacified with chronic inflammatory changes, there also other evidence of chronic sinusitis, in left maxillary sinus.  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  There was no evidence of acute ischemic changes.   MRI of cervical spine showed degenerative changes, there was no significant canal or foraminal narrowing  UPDATE December 02 2018 She is accompanied by her mother-in-law Tammy Mcknight at today's visit, who provides supplementary history,  Since falling off porch of 10 feet in 2014, patient was noted to have significant memory loss, gradually getting worse, she had abdominal surgery, also had few cardiac arrest need resuscitation during the incident, since then, she has episode of transient dizziness, mild confusion, lasting less than 1 minute, no loss of consciousness, no seizure-like activity noticed, she is having recurrect spells almost daily basis over the past few months,  She also has strong family history of dementia, both father and mother died of  dementia in their 74s  She was noted to repeating herself, got lost while driving, and making comments that is not in line with reality,  We have personally reviewed MRI of the brain without contrast May 2020, mild changes of chronic microvascular ischemia, possible remote lacunar infarction in the right periventricular white matter, overall no change compared to previous MRI from September 2019   She was also noted to have significant atrophy, weakness of left hand, arm numbness involving left hand to left elbow level  UPDATE Mar 25 2019: She is accompanied by her mother-in-law at today's clinic visit, She continues to decline slowly, mild worsening gait abnormality, mental slowing, confusion, difficult to keep up with her medications,  Pulmonary functional test in August 2020 The FVC, FEV1, FEV1/FVC ratio and FEF25-75% are within normal limits. Lung volumes are within normal limits. Following administration of bronchodilators, there is no significant response. The reduced diffusing capacity indicates a minimal loss of functional alveolar capillary surface. However, the diffusing capacity was not corrected for the patient's hemoglobin. 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 Anemia cannot be excluded as a potential cause of the diffusion defect without correcting the observed diffusing capacity for hemoglobin.  EMG nerve conduction study in August showed findings worrisome for motor neuron disease, there is also evidence of moderate right carpal tunnel syndrome.  Laboratory evaluations in June 2020 showed normal or negative RPR, B12, HIV, folic acid, C-reactive protein, TSH, CPK, ESR, CBC, CMP showed mild elevated glucose 123  I personally reviewed MRI of cervical spine in June 2020: Mild multilevel degenerative changes, there was no significant canal  or foraminal stenosis  UPDATE November 24 2019: She is accompanied by her husband at today's  clinical visit, there was no significant change, continue has gait abnormality, muscle atrophy, memory loss  Previous EMG nerve conduction study August 2020 showed evidence of chronic neuropathic changes involving bilateral cervical, lumbar sacral myotomes, we will repeat EMG nerve conduction study   UPDATE December 31 2019: Patient is accompanied by her husband for electrodiagnostic study today, which continues show evidence of significant chronic neuropathic changes involving left T1, C8, C7 myotomes then the more proximal left cervical myotomes.  There is also subtle evidence of chronic neuropathic changes involving bilateral lower extremities.  There was no significant change compared to previous study in August 2020.  Clinical wise, patient sent her husband continue complains of slow worsening, including gait abnormality, upper extremity muscle weakness, she denies significant sensory loss, she also had gradual worsening mental confusion,  UPDATE Apr 19 2020: She is accompanied today's clinical visit, she was started on prednisone since August 2021, starting from 40 mg daily, tapering down to 20 mg daily, complains of worsening diabetes, no significant improvement in his muscle weakness,  She continue complains of almost daily function spells, feeling weak, has to be helped her husband to get up from low position, go to bed, no loss of consciousness  She also reported normal cardiac monitoring about 2 months ago, that was ordered by her primary care physician. There was no event during 1 week cardiac monitoring  She reported significant improvement for her similar spells in the past when she was started on lamotrigine 100 mg twice a day, tolerating medication well  Reviewed extensive laboratory evaluations in 2021, negative GM 1 antibody, normal ferritin 95, copper, ANA, CPK, TSH,  Spinal fluid testing July 2021, normal total protein 45, VDRL, negative oligoclonal banding, WBC of 2  Update Oct 19, 2020  She is accompanied by her mother-in-law for repeat EMG nerve conduction study, which showed widespread chronic neuropathic changes, active process involving left cervical myotomes, definite chronic neuropathic changes involving bilateral lumbosacral myotomes, subtle neuropathic changes even involving left sternocleidomastoid, orbicularis oculi, with her pain is progressive muscle weakness, atrophy, progressive functional status including gait abnormality, even dysphagia, shortness of breath, above findings 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  UPDATE Apr 28 2021: She is more forgetful, misplace things, leave door open,  She also has intermittent increased weakness, needing help to be in bed, more difficulty buttoning,   She denies swallowing difficulty,  Her depression is under good control with celexa 40mg  daily, She also has episodes of really lightheaded, usually happen in standing station  UPDATE Mar 13 2023 She is accompanied by her husband at today's clinical visit, overall stable, continue having fainting spells, described 1 episode about 2 weeks ago, she was in the shower, getting so weak, whole body went limp, no loss of consciousness, lasting for few minutes, no seizure-like activity, has been reported lamotrigine up to 300 mg daily made no difference in her above spells, which happened intermittently, once every few weeks or months  She  continues to have significant memory loss, easily get confused, repeating herself, spent most of the time playing on her tablet  Worsening left hand and arm weakness mild steady gait,  UPDATE May 30, 2023: Repeat EMG nerve conduction study May 30, 2023 continue to demonstrate chronic neuropathic changes involving bilateral cervical, right lumbosacral myotomes with well-preserved sensory component, she did has significant disability from her muscle atrophy, weakness, discussed with patient and her husband, decided a trial of IVIG, prior authorization 2 g/kg loading dose followed by 1 g/kg every 3 weeks    PHYSICAL EXAMNIATION:  Gen: NAD, conversant, well nourised,well groomed, thoughts are scattered                  NEUROLOGICAL EXAM:    03/13/2023    3:04 PM 08/01/2022    3:59 PM 11/23/2021    3:26 PM 04/28/2021   11:00 AM  Montreal Cognitive Assessment   Visuospatial/ Executive (0/5) 1 3 2 1   Naming (0/3) 3 2 2 3   Attention: Read list of digits (0/2) 0 0 0 0  Attention: Read list of letters (0/1) 1 1 1 1   Attention: Serial 7 subtraction starting at 100 (0/3) 3 0 0 0  Language: Repeat phrase (0/2) 0 0 1 0  Language : Fluency (0/1) 1 0 1 1  Abstraction (0/2) 2 1 2 2   Delayed Recall (0/5) 4 4 3  0  Orientation (0/6) 6 6 6 6   Total 21 17 18 14       CRANIAL NERVES: CN II: Visual fields are full to confrontation.  Pupils are round equal and briskly reactive to light. CN III, IV, VI: extraocular movement are normal. No ptosis. CN V: Facial sensation is intact to pinprick in all 3 divisions bilaterally.  CN VII: Face is symmetric with normal eye closure and smile.  Mild cheek puff weakness CN XI: Head turning and shoulder shrug are intact  MOTOR: Significant left hand intrinsic muscle and left forearm atrophy weakness, left wrist flexion 3, extension 4 -, finger abduction 3,  Mild bilateral hip flexion, knee flexion, extension weakness, no significant ankle dorsiflexion  weakness,   REFLEXES: Reflexes are brisk at bilateral upper extremity and at the knees, trace at ankles, bilateral plantar responses were extensor  SENSORY: Intact to soft touch to all extremities  COORDINATION: Finger-nose-finger and heel-to-shin is normal bilaterally  GAIT/STANCE: Gait is slightly wide-based, cautious, stiff, unsteady,  REVIEW OF SYSTEMS: Full 14 system review of systems performed and notable only for as above  See HPI  ALLERGIES: No Known Allergies  HOME MEDICATIONS: Current Outpatient Medications  Medication Sig Dispense Refill   atorvastatin (LIPITOR) 40 MG tablet Take 40 mg by mouth daily.     Cholecalciferol (VITAMIN D3 PO) Take 1,000 Units by mouth daily.     cinacalcet (SENSIPAR) 30 MG tablet Take 30 mg by mouth 3 (three) times a week.      citalopram (CELEXA) 40 MG tablet Take 40 mg by mouth daily.     labetalol (NORMODYNE) 200 MG tablet Take 400 mg by mouth 2 (two) times daily.     lamoTRIgine (LAMICTAL) 100 MG tablet Take 1 tablet (100 mg total) by mouth 2 (two) times daily. 180 tablet 3   memantine (NAMENDA) 10 MG tablet Take 1 tablet by mouth twice daily 180 tablet 0   valsartan (DIOVAN) 320 MG tablet TAKE 1 TABLET BY MOUTH IN THE MORNING FOR HIGH BLOOD PRESSURE     No current facility-administered medications for  this visit.    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   Dizziness due to old head injury    Elevated liver enzymes    Generalized osteoarthritis of multiple sites    GERD (gastroesophageal  reflux disease)    Herniated nucleus pulposus, C4-5 left    Hypercalcemia    Hypertension    Hyperthyroidism    Hypoxemic encephalopathy (HCC)    Left hand weakness    Lumbar radiculopathy    Memory loss    Menopausal syndrome    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HCC)    Pharyngeal dysphagia    RLS (restless legs syndrome)    Thyroid nodule     PAST SURGICAL HISTORY: Past Surgical History:  Procedure Laterality Date   ABDOMINAL HYSTERECTOMY     BREAST LUMPECTOMY WITH RADIOACTIVE SEED LOCALIZATION Left 05/13/2018   Procedure: LEFT BREAST LUMPECTOMY WITH RADIOACTIVE SEED LOCALIZATION;  Surgeon: Griselda Miner, MD;  Location: [facility] OR;  Service: General;  Laterality: Left;   BREAST SURGERY     right benign lump   HIP ARTHROPLASTY     Right   SPLENECTOMY, TOTAL     TONSILLECTOMY     TONSILLECTOMY AND ADENOIDECTOMY     TUBAL LIGATION      FAMILY HISTORY: Family History  Problem Relation Age of Onset   Heart failure Mother    Dementia Mother    Hypertension Mother    Fibromyalgia Mother    Migraines Mother    Lupus Mother    Dementia Father    Hypertension Father    Leukemia Father     SOCIAL HISTORY:   Social History   Socioeconomic History   Marital status: Married    Spouse name: Not on file   Number of children: 2   Years of education: GED   Highest education level: Not on file  Occupational History   Occupation: Diabled  Tobacco Use   Smoking status: Never   Smokeless tobacco: Never  Vaping Use   Vaping status: Never Used  Substance and Sexual Activity   Alcohol use: Not Currently   Drug use: Never   Sexual activity: Not on file  Other Topics Concern   Not on file  Social History Narrative   Lives at home with her husband.   Right-handed.   Caffeine: occasional tea, coke or pepsi zero   Social Drivers of Corporate investment banker Strain: Not on file  Food Insecurity: Low Risk  (04/06/2023)   Received from [facility]   Hunger Vital Sign     Worried About Running Out of Food in the Last Year: Never true    Ran Out of Food in the Last Year: Never true  Transportation Needs: No Transportation Needs (04/06/2023)   Received from [facility]    In the past 12 months, has lack of reliable transportation kept you from  medical appointments, meetings, work or from getting things needed for daily living? : No  Physical Activity: Not on file  Stress: Not on file  Social Connections: Not on file  Intimate Partner Violence: Not on file     Levert Feinstein, M.D. Ph.D.  [facility] [address] [place], [place] [address] Phone: [phone]

## 2023-05-31 NOTE — Progress Notes (Signed)
EMG report is under procedure tab. 

## 2023-06-10 ENCOUNTER — Other Ambulatory Visit: Payer: Self-pay | Admitting: Neurology

## 2023-06-18 ENCOUNTER — Telehealth: Payer: Self-pay | Admitting: *Deleted

## 2023-06-18 NOTE — Telephone Encounter (Signed)
 Call was for the patient's husband, Taleia Sadowski. He rescheduled his appointment today due to the weather.

## 2023-06-20 ENCOUNTER — Telehealth: Payer: Self-pay

## 2023-06-20 ENCOUNTER — Other Ambulatory Visit: Payer: Self-pay

## 2023-06-20 DIAGNOSIS — M6281 Muscle weakness (generalized): Secondary | ICD-10-CM

## 2023-06-20 NOTE — Telephone Encounter (Signed)
 Lmtrc 1st attempt:  Labs igg antibodies needed for infusion

## 2023-06-20 NOTE — Telephone Encounter (Signed)
 Phone room: Please call and let pt know she needs to complete labs per yan

## 2023-06-25 ENCOUNTER — Other Ambulatory Visit: Payer: Self-pay

## 2023-06-25 DIAGNOSIS — M6281 Muscle weakness (generalized): Secondary | ICD-10-CM

## 2023-06-26 LAB — IGG, IGA, IGM
IgA/Immunoglobulin A, Serum: 49 mg/dL — ABNORMAL LOW (ref 87–352)
IgG (Immunoglobin G), Serum: 526 mg/dL — ABNORMAL LOW (ref 586–1602)
IgM (Immunoglobulin M), Srm: 21 mg/dL — ABNORMAL LOW (ref 26–217)

## 2023-07-24 ENCOUNTER — Telehealth: Payer: Self-pay | Admitting: Neurology

## 2023-07-24 NOTE — Telephone Encounter (Signed)
Pt called with new insurance information  [employer] Member ID: [id number] Group #: [id number] BIN# [id number] PCN# [id number]  Would like a call back to confirm have got the new insurance information to have tests approved order by Dr. Terrace Arabia at [facility].

## 2023-07-24 NOTE — Telephone Encounter (Signed)
Called and clarified pt stated Dr. Terrace Arabia wanted a bonedensity test and someother test she couldn't remember the name of. Routing to dr. Terrace Arabia to let her know to proceed w/ordering now that we have insurance information and pt would like it to be done at [facility] hospital.

## 2023-07-25 NOTE — Telephone Encounter (Signed)
I try to initiate IVIG treatment, please check on her prior authorization process.

## 2023-08-07 ENCOUNTER — Encounter: Payer: Self-pay | Admitting: Neurology

## 2023-08-07 ENCOUNTER — Other Ambulatory Visit: Payer: Self-pay

## 2023-08-07 NOTE — Telephone Encounter (Signed)
Error

## 2023-08-07 NOTE — Telephone Encounter (Signed)
 Pt would like a call back to confirm bone density and Nerve Conduction, could not remember the name of one test. Want to make sure all prior authorization are cleared with new insurance.

## 2023-08-08 NOTE — Telephone Encounter (Signed)
 Per [employer] in infusion first prior authorization for IVIG was denied. OCTOGAM is preferred by insurance. They are working on getting approval through a different brand of IVIG.  Patient had nerve conduction study and EMG on 05/30/2023. I do not see any documentation to support that a bone density test or nerve conduction study has been ordered/reordered. Will check with MD.

## 2023-08-20 ENCOUNTER — Other Ambulatory Visit: Payer: Self-pay | Admitting: Anesthesiology

## 2023-08-20 MED ORDER — LAMOTRIGINE 100 MG PO TABS: 100.0000 mg | ORAL_TABLET | Freq: Two times a day (BID) | ORAL | 1 refills | Status: DC

## 2023-08-20 MED ORDER — MEMANTINE HCL 10 MG PO TABS: 10.0000 mg | ORAL_TABLET | Freq: Two times a day (BID) | ORAL | 0 refills | Status: DC

## 2023-09-10 ENCOUNTER — Telehealth: Payer: Self-pay | Admitting: Neurology

## 2023-09-10 NOTE — Telephone Encounter (Signed)
 Call to patient, no answer. Left message to return call

## 2023-09-10 NOTE — Telephone Encounter (Signed)
 [employer] sending a form for Dr. Terrace Arabia to sign and fax back for to get assistance to have infusion Also need help with medication for the infusion. Would like a call back.

## 2023-09-12 ENCOUNTER — Telehealth: Payer: Self-pay

## 2023-09-12 NOTE — Telephone Encounter (Signed)
 Formed completed, signed and faxed to [facility]

## 2023-09-18 NOTE — Telephone Encounter (Signed)
 Returned call to patient, she states she can't afford to have the infusions done, even with [employer] assistance. She would like to discuss other options. Advised I would send to Dr. Terrace Arabia to review and follow back up.

## 2023-09-18 NOTE — Telephone Encounter (Signed)
 Pt has called to report that she will not be able to afford the infusion therapy, she welcomes a call from RN to discuss.

## 2023-09-18 NOTE — Telephone Encounter (Signed)
 Call to patient, no answer. Left message message to return call

## 2023-09-18 NOTE — Telephone Encounter (Signed)
 Pt has returned the call to April, RN

## 2023-10-02 ENCOUNTER — Telehealth: Payer: Self-pay

## 2023-10-02 NOTE — Telephone Encounter (Signed)
 Notified by infusion that patient has been discharged from infusion due to cost and not interested in enrolling on  [employer] foundation

## 2023-10-10 DIAGNOSIS — J042 Acute laryngotracheitis: Secondary | ICD-10-CM | POA: Insufficient documentation

## 2023-10-18 DIAGNOSIS — J019 Acute sinusitis, unspecified: Secondary | ICD-10-CM | POA: Insufficient documentation

## 2023-12-17 ENCOUNTER — Other Ambulatory Visit: Payer: Self-pay | Admitting: Neurology

## 2024-01-22 ENCOUNTER — Emergency Department (HOSPITAL_COMMUNITY): Payer: Medicare (Managed Care)

## 2024-01-22 ENCOUNTER — Other Ambulatory Visit: Payer: Self-pay

## 2024-01-22 ENCOUNTER — Emergency Department (HOSPITAL_COMMUNITY)
Admission: EM | Admit: 2024-01-22 | Discharge: 2024-01-22 | Disposition: A | Discharge status: Home / Self Care | Payer: Medicare (Managed Care) | Attending: Emergency Medicine | Admitting: Emergency Medicine

## 2024-01-22 DIAGNOSIS — Z79899 Other long term (current) drug therapy: Secondary | ICD-10-CM | POA: Insufficient documentation

## 2024-01-22 DIAGNOSIS — R531 Weakness: Secondary | ICD-10-CM | POA: Diagnosis present

## 2024-01-22 DIAGNOSIS — I6782 Cerebral ischemia: Secondary | ICD-10-CM | POA: Insufficient documentation

## 2024-01-22 DIAGNOSIS — I10 Essential (primary) hypertension: Secondary | ICD-10-CM | POA: Insufficient documentation

## 2024-01-22 DIAGNOSIS — Z8249 Family history of ischemic heart disease and other diseases of the circulatory system: Secondary | ICD-10-CM | POA: Diagnosis not present

## 2024-01-22 DIAGNOSIS — R29818 Other symptoms and signs involving the nervous system: Secondary | ICD-10-CM | POA: Diagnosis not present

## 2024-01-22 DIAGNOSIS — F32A Depression, unspecified: Secondary | ICD-10-CM | POA: Diagnosis not present

## 2024-01-22 DIAGNOSIS — E119 Type 2 diabetes mellitus without complications: Secondary | ICD-10-CM | POA: Diagnosis not present

## 2024-01-22 LAB — COMPREHENSIVE METABOLIC PANEL WITH GFR
ALT: 49 U/L — ABNORMAL HIGH (ref 0–44)
AST: 37 U/L (ref 15–41)
Albumin: 4.2 g/dL (ref 3.5–5.0)
Alkaline Phosphatase: 58 U/L (ref 38–126)
Anion gap: 13 (ref 5–15)
BUN: 27 mg/dL — ABNORMAL HIGH (ref 8–23)
CO2: 20 mmol/L — ABNORMAL LOW (ref 22–32)
Calcium: 9.5 mg/dL (ref 8.9–10.3)
Chloride: 104 mmol/L (ref 98–111)
Creatinine, Ser: 0.85 mg/dL (ref 0.44–1.00)
GFR, Estimated: >60 mL/min (ref >60)
Glucose, Bld: 201 mg/dL — ABNORMAL HIGH (ref 70–99)
Potassium: 4.5 mmol/L (ref 3.5–5.1)
Sodium: 137 mmol/L (ref 135–145)
Total Bilirubin: 0.9 mg/dL (ref 0.0–1.2)
Total Protein: 6.2 g/dL — ABNORMAL LOW (ref 6.5–8.1)

## 2024-01-22 LAB — CBC
HCT: 41.5 % (ref 36.0–46.0)
Hemoglobin: 13.7 g/dL (ref 12.0–15.0)
MCH: 30.9 pg (ref 26.0–34.0)
MCHC: 33 g/dL (ref 30.0–36.0)
MCV: 93.5 fL (ref 80.0–100.0)
Platelets: 258 K/uL (ref 150–400)
RBC: 4.44 MIL/uL (ref 3.87–5.11)
RDW: 13.3 % (ref 11.5–15.5)
WBC: 10.9 K/uL — ABNORMAL HIGH (ref 4.0–10.5)
nRBC: 0 % (ref 0.0–0.2)

## 2024-01-22 LAB — CBG MONITORING, ED: Glucose-Capillary: 192 mg/dL — ABNORMAL HIGH (ref 70–99)

## 2024-01-22 LAB — DIFFERENTIAL
Abs Immature Granulocytes: 0.02 K/uL (ref 0.00–0.07)
Basophils Absolute: 0.1 K/uL (ref 0.0–0.1)
Basophils Relative: 1 %
Eosinophils Absolute: 0.2 K/uL (ref 0.0–0.5)
Eosinophils Relative: 2 %
Immature Granulocytes: 0 %
Lymphocytes Relative: 37 %
Lymphs Abs: 4 K/uL (ref 0.7–4.0)
Monocytes Absolute: 1 K/uL (ref 0.1–1.0)
Monocytes Relative: 9 %
Neutro Abs: 5.5 K/uL (ref 1.7–7.7)
Neutrophils Relative %: 51 %

## 2024-01-22 LAB — PROTIME-INR
INR: 0.9 (ref 0.8–1.2)
Prothrombin Time: 12.6 s (ref 11.4–15.2)

## 2024-01-22 LAB — ETHANOL: Alcohol, Ethyl (B): <15 mg/dL (ref <15)

## 2024-01-22 LAB — I-STAT CHEM 8, ED
BUN: 30 mg/dL — ABNORMAL HIGH (ref 8–23)
Calcium, Ion: 1.06 mmol/L — ABNORMAL LOW (ref 1.15–1.40)
Chloride: 106 mmol/L (ref 98–111)
Creatinine, Ser: 0.8 mg/dL (ref 0.44–1.00)
Glucose, Bld: 200 mg/dL — ABNORMAL HIGH (ref 70–99)
HCT: 40 % (ref 36.0–46.0)
Hemoglobin: 13.6 g/dL (ref 12.0–15.0)
Potassium: 4.3 mmol/L (ref 3.5–5.1)
Sodium: 138 mmol/L (ref 135–145)
TCO2: 20 mmol/L — ABNORMAL LOW (ref 22–32)

## 2024-01-22 LAB — APTT: aPTT: 23 s — ABNORMAL LOW (ref 24–36)

## 2024-01-22 MED ORDER — SODIUM CHLORIDE 0.9% FLUSH
3.0000 mL | Freq: Once | INTRAVENOUS | Status: AC
  Administered 2024-01-22 (×2): 3 mL via INTRAVENOUS

## 2024-01-22 NOTE — ED Notes (Signed)
Pt able to use bedpan 

## 2024-01-22 NOTE — Code Documentation (Signed)
 Stroke Response Nurse Documentation Code Documentation  Tammy Mcknight is a 68 y.o. female arriving to [facility]  via [facility] on 8/12 with past medical hx of  GERD, DM2, HTN, hyperthyroidism. On No antithrombotic. Code stroke was activated by EMS.   Patient from home where she was LKW at 1500 and now complaining of generalized weakness.   Stroke team at the bedside on patient arrival. Labs drawn and patient cleared for CT by Dr. Patsey. Patient to CT with team. NIHSS 4, see documentation for details and code stroke times. Patient with bilateral arm weakness and bilateral leg weakness on exam. The following imaging was completed:  CT Head. Patient is not a candidate for IV Thrombolytic due to Outside of window. Patient is not a candidate for IR due to low suspicion of stroke.   Care Plan: Neuro checks q2 hrs.   Bedside handoff with ED RN Campbell.    [name] ORN  Rapid Response RN

## 2024-01-22 NOTE — ED Triage Notes (Signed)
 Pt BIB by EMS from home, LKN at 1500. At 1800, husband found pt in recliner, w/ slurred speech and increased left sided weakness. Per husband pt has previous left sided weakness from previous stroke. Hx of motor neuron disease also. GCS 15.

## 2024-01-22 NOTE — ED Provider Notes (Signed)
 [place] EMERGENCY DEPARTMENT AT [name] Provider Note   CSN: [id number] Arrival date & time: 01/22/24  8062  An emergency department physician performed an initial assessment on this suspected stroke patient at 18.  Patient presents with: Code Stroke   Tammy Mcknight is a 68 y.o. female.   HPI Patient came in as a code stroke.  Had last normal at around 3:00.  Slurred speech increased weakness.  Potential increased left-sided weakness.  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  Was post to have IVIG but had not had it done.  Sees Dr. Onita.  Met at the [facility] by Dr. Vanessa, from neurology.  Taken to head CT.   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 Authorizing Provider  atorvastatin (LIPITOR) 40 MG tablet Take 40 mg by mouth daily. 03/05/18   [provider]  Cholecalciferol (VITAMIN D3 PO) Take 1,000 Units by mouth daily.    [provider]  cinacalcet (SENSIPAR) 30 MG tablet Take 30 mg by mouth 3 (three) times a week.     [provider]  citalopram (CELEXA) 40 MG tablet Take 40 mg by mouth daily. 01/02/19   [provider]  labetalol (NORMODYNE) 200 MG tablet Take 400 mg by mouth 2 (two) times daily.    [provider]  lamoTRIgine  (LAMICTAL ) 100 MG tablet Take 1 tablet (100 mg total) by mouth 2 (two) times daily. 08/20/23   Onita Duos, MD  memantine  (NAMENDA ) 10 MG tablet TAKE 1 TABLET TWICE A DAY 12/17/23   Gayland Lauraine PARAS, NP  valsartan (DIOVAN) 320 MG tablet TAKE 1 TABLET BY MOUTH IN THE MORNING FOR HIGH BLOOD PRESSURE 03/16/19   [provider]    Allergies: Patient has no known allergies.    Review of Systems  Updated Vital Signs BP 119/87   Pulse 78   Temp 98.9 F (37.2 C) (Oral)   Resp 16   Ht 5' 4 (1.626 m)   Wt 72.6 kg   SpO2 100%   BMI 27.47 kg/m   Physical Exam Vitals and nursing note reviewed.  Cardiovascular:     Rate and Rhythm: Regular rhythm.  Chest:  Chest wall: No tenderness.  Abdominal:     Tenderness: There is no abdominal tenderness.  Neurological:     Mental Status: She is alert.     Comments: Somewhat variable exam but no definite weakness.  Appears symmetric.  Awake and appropriate.     (all labs ordered are listed, but only abnormal results are displayed) Labs Reviewed  APTT - Abnormal; Notable for the following components:      Result Value   aPTT 23 (*)    All other components within normal limits  CBC - Abnormal; Notable for the following components:   WBC 10.9 (*)    All other components within normal limits  COMPREHENSIVE METABOLIC PANEL WITH GFR - Abnormal; Notable for the following components:   CO2 20 (*)    Glucose, Bld 201 (*)    BUN 27 (*)    Total Protein 6.2 (*)    ALT 49 (*)    All other components within normal limits  I-STAT CHEM 8, ED - Abnormal; Notable for the following components:   BUN 30 (*)     Glucose, Bld 200 (*)    Calcium, Ion 1.06 (*)    TCO2 20 (*)    All other components within normal limits  CBG MONITORING, ED - Abnormal; Notable for the following components:   Glucose-Capillary 192 (*)    All other components within normal limits  PROTIME-INR  DIFFERENTIAL  ETHANOL    EKG: None  Radiology: CT HEAD CODE STROKE WO CONTRAST Result Date: 01/22/2024 CLINICAL DATA:  Code stroke. Initial evaluation for acute neuro deficit, stroke suspected. EXAM: CT HEAD WITHOUT CONTRAST TECHNIQUE: Contiguous axial images were obtained from the base of the skull through the vertex without intravenous contrast. RADIATION DOSE REDUCTION: This exam was performed according to the departmental dose-optimization program which includes automated exposure control, adjustment of the mA and/or kV according to patient size and/or use of iterative reconstruction technique. COMPARISON:  None Available comparison made with prior study from 10/16/2018 No acute intracranial hemorrhage. No acute large vessel territory infarct. No mass lesion or midline shift. No hydrocephalus or extra-axial fluid collection. FINDINGS: Brain: Cerebral volume within normal limits. Scattered patchy hypodensity involving the supratentorial cerebral white matter, most consistent with chronic small vessel ischemic disease, mild in nature, and mildly progressed from prior. Vascular: No abnormal hyperdense vessel. Scattered vascular calcifications noted within the carotid siphons. Skull: Scalp soft tissues within normal limits.  Calvarium intact. Sinuses/Orbits: Globes and orbital soft tissues within normal limits. Mild right sphenoid sinus disease. Paranasal sinuses are otherwise clear. No mastoid effusion. Other: None. ASPECTS [facility] Stroke Program Early CT Score) - Ganglionic level infarction (caudate, lentiform nuclei, internal capsule, insula, M1-M3 cortex): 7 - Supraganglionic infarction (M4-M6 cortex): 3 Total score (0-10 with 10 being  normal): 10 IMPRESSION: 1. No acute intracranial abnormality. 2. ASPECTS is 10. 3. Mild chronic microvascular ischemic disease, mildly progressed from prior These results were communicated to Dr. Vanessa at 7:55 pm on 01/22/2024 by text page via the [facility] messaging system. Electronically Signed   By: Morene Hoard M.D.   On: 01/22/2024 19:57     Procedures   Medications Ordered in the ED  sodium chloride  flush (NS) 0.9 % injection 3 mL (3 mLs Intravenous Given 01/22/24 2006)                                    Medical Decision Making  Amount and/or Complexity of Data Reviewed Labs: ordered. Radiology: ordered.   Patient with potential focal neurodeficits.  However awake and appropriate and moving all extremities on her exam.  No clear deficits.  Reviewed neurology note.  Differential diagnosis does include stroke, functional deficits.  Workup reassuring.  Head CT reassuring.  Independently interpreted by me.  Discussed with neurology and thinks more functional at this time.  However there are other causes such as seizure can considered.  I think patient can be discharged and follow-up with outpatient neurology.  Will discharge.  CRITICAL CARE Performed by: [place] Total critical care time: 30 minutes Critical care time was exclusive of separately billable procedures and treating other patients. Critical care was necessary to treat or prevent imminent or life-threatening deterioration. Critical care was time spent personally by me on the following activities: development of treatment plan with patient and/or surrogate as well as nursing, discussions with consultants, evaluation of patient's response to treatment, examination of patient, obtaining history from patient or surrogate, ordering and performing treatments and interventions, ordering and review of laboratory studies, ordering and review of radiographic studies, pulse oximetry and re-evaluation of patient's  condition.      Final diagnoses:  Weakness    ED Discharge Orders          Ordered    Ambulatory referral to Neurology       Comments: An appointment is requested in approximately: 1 week   01/22/24 2138               River Rankin, MD 01/22/24 2138

## 2024-01-22 NOTE — Consult Note (Signed)
 NEUROLOGY CONSULT NOTE   Date of service: January 22, 2024 Patient Name: Tammy Mcknight MRN:  [id number] DOB:  [birth year] Chief Complaint: generalized weakness Requesting Provider: No att. providers found  History of Present Illness  Tammy Mcknight is a 68 y.o. female with hx of GERD, DM2, HTN, hyperthyroidism, prior hx of neuropathic changes on EMG concerning for multifocal motor neuropathy versus motor neuron disease and baseline slight left-sided weakness and dementia who was brought in by EMS for generalized weakness.  Per husband Mr. Tammy Mcknight, patient had some generalized weakness since 8 PM last night.  However, she seemed fine when she sat down in the recliner today at 3 PM when he got home.  At 6 PM, patient attempted to get up from the recliner that she got feel weak and pale and had significant difficulty getting out of the recliner.  Her legs were giving out and her speech was very slurred.  He called EMS and she was brought in as a code stroke with an initially reported last seen normal at 1800.  However, upon further discussion with husband, last seen normal seems to be around 1500.  Of note, patient has been depressed, reporting to husband that she does not want to be this way. Does not endorse any SI.   LKW: 1500 Modified rankin score: 3-Moderate disability-requires help but walks WITHOUT assistance IV Thrombolysis: not offered, outside window EVT: not offered, low suspicion for stroke.  NIHSS components Score: Comment  1a Level of Conscious 0[x]  1[]  2[]  3[]      1b LOC Questions 0[x]  1[]  2[]       1c LOC Commands 0[x]  1[]  2[]       2 Best Gaze 0[x]  1[]  2[]       3 Visual 0[x]  1[]  2[]  3[]      4 Facial Palsy 0[x]  1[]  2[]  3[]      5a Motor Arm - left 0[]  1[x]  2[]  3[]  4[]  UN[]    5b Motor Arm - Right 0[]  1[x]  2[]  3[]  4[]  UN[]    6a Motor Leg - Left 0[]  1[x]  2[]  3[]  4[]  UN[]    6b Motor Leg - Right 0[]  1[x]  2[]  3[]  4[]  UN[]    7 Limb Ataxia 0[x]  1[]  2[]  UN[]       8 Sensory 0[x]  1[]  2[]  UN[]      9 Best Language 0[x]  1[]  2[]  3[]      10 Dysarthria 0[x]  1[]  2[]  UN[]      11 Extinct. 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   Dizziness due to old head injury    Elevated liver enzymes    Generalized osteoarthritis of multiple sites    GERD (gastroesophageal reflux disease)    Herniated nucleus pulposus, C4-5 left  Hypercalcemia    Hypertension    Hyperthyroidism    Hypoxemic encephalopathy (HCC)    Left hand weakness    Lumbar radiculopathy    Memory loss    Menopausal syndrome    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HCC)    Pharyngeal dysphagia    RLS (restless legs syndrome)    Thyroid  nodule     Past Surgical History:  Procedure Laterality Date   ABDOMINAL HYSTERECTOMY     BREAST LUMPECTOMY WITH RADIOACTIVE SEED LOCALIZATION Left 05/13/2018   Procedure: LEFT BREAST LUMPECTOMY WITH RADIOACTIVE SEED LOCALIZATION;  Surgeon: Curvin Deward MOULD, MD;  Location: [facility] OR;  Service: General;  Laterality: Left;   BREAST SURGERY     right benign lump   HIP ARTHROPLASTY     Right   SPLENECTOMY, TOTAL     TONSILLECTOMY     TONSILLECTOMY AND ADENOIDECTOMY     TUBAL LIGATION      Family History: Family History  Problem Relation Age of Onset   Heart failure Mother    Dementia Mother    Hypertension Mother    Fibromyalgia Mother    Migraines Mother    Lupus Mother    Dementia Father     Hypertension Father    Leukemia Father     Social History  reports that she has never smoked. She has never used smokeless tobacco. She reports that she does not currently use alcohol. She reports that she does not use drugs.  No Known Allergies  Medications   Current Facility-Administered Medications:    sodium chloride  flush (NS) 0.9 % injection 3 mL, 3 mL, Intravenous, Once, Patsey Lot, MD  Current Outpatient Medications:    atorvastatin (LIPITOR) 40 MG tablet, Take 40 mg by mouth daily., Disp: , Rfl:    Cholecalciferol (VITAMIN D3 PO), Take 1,000 Units by mouth daily., Disp: , Rfl:    cinacalcet (SENSIPAR) 30 MG tablet, Take 30 mg by mouth 3 (three) times a week. , Disp: , Rfl:    citalopram (CELEXA) 40 MG tablet, Take 40 mg by mouth daily., Disp: , Rfl:    labetalol (NORMODYNE) 200 MG tablet, Take 400 mg by mouth 2 (two) times daily., Disp: , Rfl:    lamoTRIgine  (LAMICTAL ) 100 MG tablet, Take 1 tablet (100 mg total) by mouth 2 (two) times daily., Disp: 180 tablet, Rfl: 1   memantine  (NAMENDA ) 10 MG tablet, TAKE 1 TABLET TWICE A DAY, Disp: 180 tablet, Rfl: 3   valsartan (DIOVAN) 320 MG tablet, TAKE 1 TABLET BY MOUTH IN THE MORNING FOR HIGH BLOOD PRESSURE, Disp: , Rfl:   Vitals   There were no vitals filed for this visit.  There is no height or weight on file to calculate BMI.   Physical Exam   General: Laying comfortably in bed; in no acute distress.  HENT: Normal oropharynx and mucosa. Normal external appearance of ears and nose.  Neck: Supple, no pain or tenderness  CV: No JVD. No peripheral edema.  Pulmonary: Symmetric Chest rise. Normal respiratory effort.  Abdomen: Soft to touch, non-tender.  Ext: No cyanosis, edema, or deformity  Skin: No rash. Normal palpation of skin.   Musculoskeletal: Normal digits and nails by inspection. No clubbing.   Neurologic Examination  Mental status/Cognition: Alert, oriented to self, place, month and year, good attention.   Speech/language: slow, hypophonic speech initially that improved during encounter. Fluent, comprehension intact, object naming intact, repetition intact.  Cranial nerves:   CN II Pupils equal and reactive to light, no  VF deficits    CN III,IV,VI EOM intact, no gaze preference or deviation, no nystagmus    CN V normal sensation in V1, V2, and V3 segments bilaterally    CN VII no asymmetry, no nasolabial fold flattening    CN VIII normal hearing to speech    CN IX & X normal palatal elevation, no uvular deviation    CN XI 5/5 head turn and 5/5 shoulder shrug bilaterally    CN XII midline tongue protrusion    Motor:  Muscle bulk: poor, tone normal. Mvmt Root Nerve  Muscle Right Left Comments  SA C5/6 Ax Deltoid     EF C5/6 Mc Biceps 4 4   EE C6/7/8 Rad Triceps 4 4   WF C6/7 Med FCR     WE C7/8 PIN ECU     F Ab C8/T1 U ADM/FDI 4 4   HF L1/2/3 Fem Illopsoas 4 4   KE L2/3/4 Fem Quad     DF L4/5 D Peron Tib Ant 4 4   PF S1/2 Tibial Grc/Sol 4 4    Sensation:  Light touch Subjective numbness with no objective sensory deficit.   Pin prick    Temperature    Vibration   Proprioception    Coordination/Complex Motor:  - Finger to Nose normal - Heel to shin unable to do - Rapid alternating movement are slowed BL - Gait: deferred.  Labs/Imaging/Neurodiagnostic studies   CBC: No results for input(s): WBC, NEUTROABS, HGB, HCT, MCV, PLT in the last 168 hours. Basic Metabolic Panel:  Lab Results  Component Value Date   NA 144 09/02/2019   K 4.7 09/02/2019   CO2 23 09/02/2019   GLUCOSE 117 (H) 09/02/2019   BUN 19 09/02/2019   CREATININE 0.74 09/02/2019   CALCIUM 9.6 09/02/2019   GFRNONAA 85 09/02/2019   GFRAA 98 09/02/2019   Lipid Panel: No results found for: LDLCALC HgbA1c:  Lab Results  Component Value Date   HGBA1C 6.1 (H) 08/01/2022   Urine Drug Screen: No results found for: LABOPIA, COCAINSCRNUR, LABBENZ, AMPHETMU, THCU, LABBARB  Alcohol Level  No results found for: ETH INR No results found for: INR APTT No results found for: APTT AED levels:  Lab Results  Component Value Date   LAMOTRIGINE  5.5 04/19/2020    CT Head without contrast(Personally reviewed): CTH was negative for a large hypodensity concerning for a large territory infarct or hyperdensity concerning for an ICH  ASSESSMENT   Tammy Mcknight is a 68 y.o. female with hx of GERD, DM2, HTN, hyperthyroidism, prior hx of neuropathic changes on EMG concerning for multifocal motor neuropathy versus motor neuron disease and baseline slight left-sided weakness and dementia who was brought in by EMS for generalized weakness.  Exam noted with significant improvement with encouragement/coaxing and no focal deficits. 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 She is pretty much back to her baseline.  RECOMMENDATIONS  - no further inpatient neurologic workup. If symptoms are persistent, can consider getting MRI Brain. Would recommend PT/OT eval if persistent symptoms. ______________________________________________________________________  Plan discussed with Dr. Patsey with the ED team.  Signed, Jari Dipasquale, MD [facility]

## 2024-01-24 ENCOUNTER — Telehealth: Payer: Self-pay | Admitting: Neurology

## 2024-01-24 NOTE — Telephone Encounter (Signed)
 Call to patient and she states she went to the hospital Tuesday for weakness. She reports increased weakness and was advisede to asked ot be be seen sonner moved appt upto 9/2. Will make Dr. Onita aware

## 2024-01-24 NOTE — Telephone Encounter (Signed)
 Pt called requesting to speak to the nurse regarding being seen sooner. She states that her PCP advised her to call and request a sooner appt. Please advise.

## 2024-02-12 ENCOUNTER — Encounter: Payer: Self-pay | Admitting: Neurology

## 2024-02-12 ENCOUNTER — Ambulatory Visit: Payer: Medicare (Managed Care) | Admitting: Neurology

## 2024-02-12 VITALS — BP 158/78 | HR 73 | Ht 67.0 in | Wt 152.0 lb

## 2024-02-12 DIAGNOSIS — R4189 Other symptoms and signs involving cognitive functions and awareness: Secondary | ICD-10-CM | POA: Diagnosis not present

## 2024-02-12 DIAGNOSIS — R410 Disorientation, unspecified: Secondary | ICD-10-CM | POA: Diagnosis not present

## 2024-02-12 NOTE — Progress Notes (Signed)
 Assessment and plan: Progressive painless muscle weakness, atrophy,  Patient complains of painless muscle atrophy, weakness over the past few years now complains of increased bilateral upper extremity, lower extremity weakness      EMG nerve conduction study today Oct 20, 2020, showed chronic widespread neuropathic changes including cervical, bilateral lumbar sacral myotomes, even subtle neuropathic changes involving left sternocleidomastoid, left orbicularis oculi, above findings raise the possibility of motor neuron disease, there is also correlate with her examination, hyperreflexia of bilateral upper and lower extremity, in the setting of significant muscle atrophy, weakness  Well-preserved sensory response, significant motor abnormality, major weakness is at the left upper extremity, 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  History of hypoxic brain injury, cardiac arrest in 2014 Gradual worsening cognitive impairment  Mini-Mental status 23/30 today Recurrent spells of dizziness, sometimes to transient loss of consciousness prolonged spell January 22, 2024  Differentiation diagnoses remain syncope/presyncope versus partial seizure  Advised her to check blood pressure daily, and document,  MRI of the brain  Repeat EEG Return in 6 months   Reviewed  DATA: I have personally  reviewed MRI findings, laboratory result, referring note: MRI of the cervical spine without contrast in June 2020  1.   The spinal cord appears normal. 2.   The central canal is narrowed at C3-C4, C5-C6 and C6-C7 but not enough to be considered spinal stenosis. 3.   Multilevel degenerative changes as detailed above.  There is no definite nerve root compression, though there is moderate left foraminal narrowing at C3-C4 encroaching upon the left C4 nerve root 4.   No significant changes compared to the 11/11/2015 MRI.   MRI scan of the brain in May 2020, small vessel disease and possible remote age lacunar infarct in the right periventricular white matter.  Overall no significant change compared with previous MRI from 02/25/2018  MRI of thoracic spine in May 2022 showed no significant abnormality.  MRI of lumbar spine in May 2022 multilevel degenerative changes, no significant canal or foraminal narrowing  CT of the chest without contrast.  Trace pericardial effusion, no acute airspace disease,  CSF July 2021: WBC 2, glucose 74, protein 45  Laboratory evaluations in 2021: Normal A1c, lamotrigine  5.5, ANA, TSH, CPK, copper, ferritin, negative anti-GM 1 antibody, lipid profile, CBC, elevated WBC of 13.3, CMP, previously negative HIV, RPR, normal folic acid , C-reactive protein,   HISTORICAL  Tammy Mcknight is a 68 year old female, seen in request by her primary care Dr. Lamar Mcknight evaluation of dizziness, unsteady gait,  I have reviewed and summarized the referring note from the referring physician.  Also revealed multiple Tammy Mcknight chart, patient was able to provide limited detail in her previous injury,  She fell off her porch while feeding the bird in 2014, when she awakened she felt confused, but was able to reach out to her husband to call for help, was initially treated at [facility], later was transferred to [facility], per record,  she suffered a ruptured  spleen, head injury, she received massive blood transfusion, also reported acute renal failure, per patient, during hospital stay she also suffered cardiac arrest  She also had a history of right hip replacement in May 2019,  She used to work in home health aide, 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   I reviewed outside record, MRI of brain in June 2017, right maxillary sinus is opacified with chronic inflammatory changes, there also other evidence of chronic sinusitis, in left maxillary sinus.  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  There was no evidence of acute ischemic changes.   MRI of cervical spine showed degenerative changes, there was no significant canal or foraminal narrowing  UPDATE December 02 2018 She is accompanied by her mother-in-law Tammy Mcknight  at today's visit, who provides supplementary history,  Since falling off porch of 10 feet in 2014, patient was noted to have significant memory loss, gradually getting worse, she had abdominal surgery, also had few cardiac arrest need resuscitation during the incident, since then, she has episode of transient dizziness, mild confusion, lasting less than 1 minute, no loss of consciousness, no seizure-like activity noticed, she is having recurrect spells almost daily basis over the past few months,  She also has strong family history of dementia, both father and  mother died of dementia in their 36s  She was noted to repeating herself, got lost while driving, and making comments that is not in line with reality,  We have personally reviewed MRI of the brain without contrast May 2020, mild changes of chronic microvascular ischemia, possible remote lacunar infarction in the right periventricular white matter, overall no change compared to previous MRI from September 2019   She was also noted to have significant atrophy, weakness of left hand, arm numbness involving left hand to left elbow level  UPDATE Mar 25 2019: She is accompanied by her mother-in-law at today's clinic visit, She continues to decline slowly, mild worsening gait abnormality, mental slowing, confusion, difficult to keep up with her medications,  Pulmonary functional test in August 2020 The FVC, FEV1, FEV1/FVC ratio and FEF25-75% are within normal limits. Lung volumes are within normal limits. Following administration of bronchodilators, there is no significant response. The reduced diffusing capacity indicates a minimal loss of functional alveolar capillary surface. However, the diffusing capacity was not corrected for the patient's hemoglobin. 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 Anemia cannot be excluded as a potential cause of the diffusion defect without correcting the observed diffusing capacity for hemoglobin.  EMG nerve conduction study in August showed findings worrisome for motor neuron disease, there is also evidence of moderate right carpal tunnel syndrome.  Laboratory evaluations in June 2020 showed normal or negative RPR, B12, HIV, folic acid , C-reactive protein, TSH, CPK, ESR, CBC, CMP showed mild elevated glucose 123  I personally reviewed MRI of cervical spine in June 2020: Mild multilevel degenerative changes, there was no significant canal  or foraminal stenosis  UPDATE November 24 2019: She is accompanied by her husband at  today's clinical visit, there was no significant change, continue has gait abnormality, muscle atrophy, memory loss  Previous EMG nerve conduction study August 2020 showed evidence of chronic neuropathic changes involving bilateral cervical, lumbar sacral myotomes, we will repeat EMG nerve conduction study   UPDATE December 31 2019: Patient is accompanied by her husband for electrodiagnostic study today, which continues show evidence of significant chronic neuropathic changes involving left T1, C8, C7 myotomes then the more proximal left cervical myotomes.  There is also subtle evidence of chronic neuropathic changes involving bilateral lower extremities.  There was no significant change compared to previous study in August 2020.  Clinical wise, patient sent her husband continue complains of slow worsening, including gait abnormality, upper extremity muscle weakness, she denies significant sensory loss, she also had gradual worsening mental confusion,  UPDATE Apr 19 2020: She is accompanied today's clinical visit, she was started on prednisone  since August 2021, starting from 40 mg daily, tapering down to 20 mg daily, complains of worsening diabetes, no significant improvement in his muscle weakness,  She continue complains of almost daily function spells, feeling weak, has to be helped her husband to get up from low position, go to bed, no loss of consciousness  She also reported normal cardiac monitoring about 2 months ago, that was ordered by her primary care physician. There was no event during 1 week cardiac monitoring  She reported significant improvement for her similar spells in the past when she was started on lamotrigine  100 mg twice a day, tolerating medication well  Reviewed extensive laboratory evaluations in 2021, negative GM 1 antibody, normal ferritin 95, copper, ANA, CPK, TSH,  Spinal fluid testing July 2021, normal total protein 45, VDRL, negative oligoclonal banding, WBC of  2  Update Oct 19, 2020  She is accompanied by her mother-in-law for repeat EMG nerve conduction study, which showed widespread chronic neuropathic changes, active process involving left cervical myotomes, definite chronic neuropathic changes involving bilateral lumbosacral myotomes, subtle neuropathic changes even involving left sternocleidomastoid, orbicularis oculi, with her pain is progressive muscle weakness, atrophy, progressive functional status including gait abnormality, even dysphagia, shortness of breath, above findings 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  UPDATE Apr 28 2021: She is more forgetful, misplace things, leave door open,  She also has intermittent increased weakness, needing help to be in bed, more difficulty buttoning,   She denies swallowing difficulty,  Her depression is under good control with celexa 40mg  daily, She also has episodes of really lightheaded, usually happen in standing station  UPDATE Mar 13 2023 She is accompanied by her husband at today's clinical visit, overall stable, continue having fainting spells, described 1 episode about 2 weeks ago, she was in the shower, getting so weak, whole body went limp, no loss of consciousness, lasting for few minutes, no seizure-like activity, has been reported lamotrigine  up to 300 mg daily made no difference in her above spells, which happened intermittently, once every few weeks or  months  She continues to have significant memory loss, easily get confused, repeating herself, spent most of the time playing on her tablet  Worsening left hand and arm weakness mild steady gait,  UPDATE May 30, 2023: Repeat EMG nerve conduction study May 30, 2023 continue to demonstrate chronic neuropathic changes involving bilateral cervical, right lumbosacral myotomes with well-preserved sensory component, she did has significant disability from her muscle atrophy, weakness, discussed with patient and her husband, decided a trial of IVIG, prior authorization 2 g/kg loading dose followed by 1 g/kg every 3 weeks  UPDATE Sept 2 2025: She is accompanied by her sister-in-law, had 1 passing out episode January 22, 2024, husband came back home found she was confused, could not talk, she has no recollection of the event, there was no weakness the seizure activity, she was taken to emergency room  CT head showed no significant abnormality  Laboratory A1c 6.8, in May, CMP showed mildly low calcium 1.08, hemoglobin of 13.6, Documented blood pressure was 110/60's, which is much lower than today's blood pressure 158/78, she is on Diovan 320 mg, labetalol 400 mg twice a day  She has recurrent spells of sudden onset of weakness, but often without loss of consciousness PHYSICAL EXAMNIATION:  Gen: NAD, conversant, well nourised,well groomed, thoughts are scattered                  NEUROLOGICAL EXAM:    03/13/2023    3:04 PM 08/01/2022    3:59 PM 11/23/2021    3:26 PM 04/28/2021   11:00 AM  Montreal Cognitive Assessment   Visuospatial/ Executive (0/5) 1 3 2 1   Naming (0/3) 3 2 2 3   Attention: Read list of digits (0/2) 0 0 0 0  Attention: Read list of letters (0/1) 1 1 1 1   Attention: Serial 7 subtraction starting at 100 (0/3) 3 0 0 0  Language: Repeat phrase (0/2) 0 0 1 0  Language : Fluency (0/1) 1 0 1 1  Abstraction (0/2) 2 1 2 2   Delayed Recall (0/5) 4 4 3  0  Orientation (0/6) 6 6 6 6    Total 21 17 18 14        02/12/2024   11:00 AM 11/23/2021    2:42 PM 10/19/2020    3:32 PM  MMSE - Mini Mental State Exam  Orientation to time 5 4 5   Orientation to Place 4 3 5   Registration 3 3 3   Attention/ Calculation 1 5 5   Recall 3 3 2   Language- name 2 objects 2 2 2   Language- repeat 1 1 0  Language- follow 3 step command 3 3 1   Language- read & follow direction 1 1 1   Write a sentence 0 1 1  Copy design 0 0 0  Total score 23 26 25       CRANIAL NERVES: CN II: Visual fields are full to confrontation.  Pupils are round equal and briskly reactive to light. CN III, IV, VI: extraocular movement are normal. No ptosis. CN V: Facial sensation is intact to pinprick in all 3 divisions bilaterally.  CN VII: Face is symmetric with normal eye closure and smile.  Mild cheek puff weakness CN XI: Head turning and shoulder shrug are intact  MOTOR: Significant left hand intrinsic muscle and left forearm atrophy weakness, left wrist flexion 3, extension 4 -, finger abduction 3, grip 2, No significant proximal upper extremity and lower extremity muscle weakness   REFLEXES: Reflexes are brisk at bilateral upper extremity and at  the knees, trace at ankles, bilateral plantar responses were mute bilaterally.  SENSORY: Intact to soft touch to all extremities  COORDINATION: Finger-nose-finger and heel-to-shin is normal bilaterally  GAIT/STANCE: Gait is slightly wide-based, cautious, stiff, unsteady,  REVIEW OF SYSTEMS: Full 14 system review of systems performed and notable only for as above  See HPI  ALLERGIES: No Known Allergies  HOME MEDICATIONS: Current Outpatient Medications  Medication Sig Dispense Refill   atorvastatin (LIPITOR) 40 MG tablet Take 40 mg by mouth daily.     Cholecalciferol (VITAMIN D3 PO) Take 1,000 Units by mouth daily.     cinacalcet (SENSIPAR) 30 MG tablet Take 30 mg by mouth 3 (three) times a week.      citalopram (CELEXA) 40 MG tablet Take 40 mg by mouth  daily.     labetalol (NORMODYNE) 200 MG tablet Take 400 mg by mouth 2 (two) times daily.     lamoTRIgine  (LAMICTAL ) 100 MG tablet Take 1 tablet (100 mg total) by mouth 2 (two) times daily. 180 tablet 1   memantine  (NAMENDA ) 10 MG tablet TAKE 1 TABLET TWICE A DAY 180 tablet 3   valsartan (DIOVAN) 320 MG tablet TAKE 1 TABLET BY MOUTH IN THE MORNING FOR HIGH BLOOD PRESSURE     No current facility-administered medications for this visit.    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   Dizziness due to old head injury    Elevated liver enzymes    Generalized osteoarthritis of multiple sites    GERD (gastroesophageal reflux disease)    Herniated nucleus pulposus, C4-5 left    Hypercalcemia    Hypertension    Hyperthyroidism    Hypoxemic encephalopathy (HCC)    Left hand weakness    Lumbar radiculopathy    Memory loss    Menopausal syndrome    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HCC)    Pharyngeal dysphagia    RLS (restless legs syndrome)    Thyroid  nodule     PAST SURGICAL HISTORY: Past Surgical History:  Procedure Laterality Date   ABDOMINAL HYSTERECTOMY     BREAST LUMPECTOMY WITH RADIOACTIVE SEED LOCALIZATION Left 05/13/2018   Procedure: LEFT BREAST LUMPECTOMY WITH RADIOACTIVE SEED LOCALIZATION;  Surgeon: Curvin Deward MOULD, MD;  Location: [facility] OR;  Service: General;  Laterality: Left;   BREAST SURGERY     right benign lump   HIP ARTHROPLASTY     Right   SPLENECTOMY, TOTAL     TONSILLECTOMY     TONSILLECTOMY AND ADENOIDECTOMY     TUBAL LIGATION       FAMILY HISTORY: Family History  Problem Relation Age of Onset   Heart failure Mother    Dementia Mother    Hypertension Mother    Fibromyalgia Mother    Migraines Mother    Lupus Mother    Dementia Father    Hypertension Father    Leukemia Father  SOCIAL HISTORY:   Social History   Socioeconomic History   Marital status: Married    Spouse name: Not on file   Number of children: 2   Years of education: GED   Highest education level: Not on file  Occupational History   Occupation: Diabled  Tobacco Use   Smoking status: Never   Smokeless tobacco: Never  Vaping Use   Vaping status: Never Used  Substance and Sexual Activity   Alcohol use: Not Currently   Drug use: Never   Sexual activity: Not on file  Other Topics Concern   Not on file  Social History Narrative   Lives at home with her husband.   Right-handed.   Caffeine: occasional tea, coke or pepsi zero   Social Drivers of Corporate investment banker Strain: Not on file  Food Insecurity: Low Risk  (10/25/2023)   Received from [facility]   Hunger Vital Sign    Within the past 12 months, you worried that your food would run out before you got money to buy more: Never true    Within the past 12 months, the food you bought just didn't last and you didn't have money to get more. : Never true  Transportation Needs: No Transportation Needs (10/25/2023)   Received from [facility]    In the past 12 months, has lack of reliable transportation kept you from medical appointments, meetings, work or from getting things needed for daily living? : No  Physical Activity: Not on file  Stress: Not on file  Social Connections: Not on file  Intimate Partner Violence: Not on file     Modena Callander, M.D. Ph.D.  [facility] [address] [place], [place] [address] Phone: [phone]

## 2024-02-13 ENCOUNTER — Telehealth: Payer: Self-pay | Admitting: Neurology

## 2024-02-13 NOTE — Telephone Encounter (Signed)
sent to GI they obtain [facility] Berkley Harvey [phone]

## 2024-02-18 ENCOUNTER — Encounter: Payer: Self-pay | Admitting: Neurology

## 2024-02-19 ENCOUNTER — Encounter: Payer: Self-pay | Admitting: Neurology

## 2024-02-24 ENCOUNTER — Ambulatory Visit
Admission: RE | Admit: 2024-02-24 | Discharge: 2024-02-24 | Disposition: A | Discharge status: Home / Self Care | Payer: Medicare (Managed Care) | Source: Ambulatory Visit | Attending: Neurology | Admitting: Neurology

## 2024-02-24 DIAGNOSIS — R410 Disorientation, unspecified: Secondary | ICD-10-CM

## 2024-02-24 DIAGNOSIS — R4189 Other symptoms and signs involving cognitive functions and awareness: Secondary | ICD-10-CM | POA: Diagnosis not present

## 2024-02-25 ENCOUNTER — Telehealth: Payer: Self-pay | Admitting: Neurology

## 2024-02-25 NOTE — Telephone Encounter (Signed)
 Please call patient MRI of the brain showed chronic small vessel disease mild generalized atrophy, mild progression compared to MRI scan in May 2020  There was no acute abnormality, will stay at current plan   IMPRESSION: This MRI of the brain without contrast shows the following: Scattered T2/FLAIR hyperintense foci in the subcortical and deep white matter of the cerebral hemispheres is most consistent with mild chronic microvascular ischemic change, slightly progressed compared to the 10/16/2018 MRI.  None of the foci appear to be acute. Currently mild cortical atrophy, noted most near the vertex. Mild mucoperiosteal thickening within the maxillary sinuses a couple of the ethmoid air cells.   No acute findings

## 2024-02-25 NOTE — Telephone Encounter (Signed)
 Called and spoke to pt and relayed results pt voiced gratitude and understanding

## 2024-03-10 ENCOUNTER — Ambulatory Visit: Payer: Medicare (Managed Care) | Admitting: Neurology

## 2024-03-10 DIAGNOSIS — R410 Disorientation, unspecified: Secondary | ICD-10-CM | POA: Diagnosis not present

## 2024-03-10 DIAGNOSIS — R4189 Other symptoms and signs involving cognitive functions and awareness: Secondary | ICD-10-CM

## 2024-03-13 ENCOUNTER — Other Ambulatory Visit: Payer: Self-pay | Admitting: Neurology

## 2024-03-13 NOTE — Telephone Encounter (Signed)
 Last seen on 02/12/24 Follow up scheduled on 09/09/24

## 2024-03-13 NOTE — Procedures (Signed)
   HISTORY: 68 years old female, history of hypoxic brain injury, intermittent dizziness, occasionally passing out episode  TECHNIQUE:  This is a routine 16 channel EEG recording with one channel devoted to a limited EKG recording.  It was performed during wakefulness, drowsiness and asleep.  Hyperventilation and photic stimulation were performed as activating procedures.  There are frequent muscle and movement artifact noted.  Upon maximum arousal, posterior dominant waking rhythm consistent of dysrhythmic, low amplitude, beta range activity,  Photic stimulation did not alter the tracing.  Hyperventilation produced mild/moderate buildup with higher amplitude and the slower activities noted.  During EEG recording, patient developed drowsiness and no deeper stage of sleep was achieved.  During EEG recording, there was no epileptiform discharge noted.  EKG demonstrate normal sinus rhythm.  CONCLUSION: This is a mild abnormal  EEG.  There evidence of diffuse low amplitude mildly dysrhythmic theta range activity, indicating mild bihemispheric malfunction, this can be related to her polypharmacy and history of hypoxic brain injury.  There was no epileptiform discharge.  Adnan Vanvoorhis, M.D. Ph.D.  [facility] [address] [place], [place] [address] Phone: [phone] Fax:      [phone]

## 2024-04-17 ENCOUNTER — Institutional Professional Consult (permissible substitution): Payer: Medicare (Managed Care) | Admitting: Neurology

## 2024-09-09 ENCOUNTER — Ambulatory Visit: Payer: Medicare (Managed Care) | Admitting: Neurology
